# Patient Record
Sex: Female | Born: 1965 | Race: Black or African American | Hispanic: No | Marital: Single | Smoking: Former smoker
Health system: Southern US, Community
[De-identification: ages and names within clinical notes are randomized; demographics above are authoritative.]

## PROBLEM LIST (undated history)

## (undated) DIAGNOSIS — J449 Chronic obstructive pulmonary disease, unspecified: Secondary | ICD-10-CM

## (undated) DIAGNOSIS — J219 Acute bronchiolitis, unspecified: Secondary | ICD-10-CM

## (undated) HISTORY — PX: ABDOMINAL HYSTERECTOMY: SHX81

---

## 2021-09-29 ENCOUNTER — Ambulatory Visit
Admission: EM | Admit: 2021-09-29 | Discharge: 2021-09-29 | Disposition: A | Discharge status: Home / Self Care | Payer: Self-pay | Attending: Family Medicine | Admitting: Family Medicine

## 2021-09-29 ENCOUNTER — Ambulatory Visit (INDEPENDENT_AMBULATORY_CARE_PROVIDER_SITE_OTHER): Payer: Self-pay

## 2021-09-29 ENCOUNTER — Other Ambulatory Visit: Payer: Self-pay

## 2021-09-29 ENCOUNTER — Encounter: Payer: Self-pay | Admitting: Emergency Medicine

## 2021-09-29 DIAGNOSIS — M79642 Pain in left hand: Secondary | ICD-10-CM

## 2021-09-29 DIAGNOSIS — J3089 Other allergic rhinitis: Secondary | ICD-10-CM

## 2021-09-29 MED ORDER — PREDNISONE 20 MG PO TABS: 40.0000 mg | ORAL_TABLET | Freq: Every day | ORAL | 0 refills | Status: DC

## 2021-09-29 MED ORDER — FLUTICASONE PROPIONATE 50 MCG/ACT NA SUSP: 1.0000 | Freq: Every day | NASAL | 0 refills | Status: DC

## 2021-09-29 MED ORDER — DICLOFENAC SODIUM 75 MG PO TBEC: 75.0000 mg | DELAYED_RELEASE_TABLET | Freq: Two times a day (BID) | ORAL | 0 refills | Status: DC

## 2021-09-29 NOTE — ED Triage Notes (Signed)
Pt here for left hand pain after getting caught in machine at work yesterday; pt also sts increased nasal congestion and sneezing x 3 days

## 2021-10-02 NOTE — ED Provider Notes (Signed)
  [facility]   [id number] 09/29/21 Arrival Time: 1106  ASSESSMENT & PLAN:  1. Left hand pain   2. Non-seasonal allergic rhinitis due to other allergic trigger    I have personally viewed the imaging studies ordered this visit. No hand fracture appreciated.  OTC symptom care as needed.  Meds ordered this encounter  Medications   predniSONE (DELTASONE) 20 MG tablet    Sig: Take 2 tablets (40 mg total) by mouth daily.    Dispense:  10 tablet    Refill:  0   diclofenac (VOLTAREN) 75 MG EC tablet    Sig: Take 1 tablet (75 mg total) by mouth 2 (two) times daily.    Dispense:  14 tablet    Refill:  0   fluticasone (FLONASE) 50 MCG/ACT nasal spray    Sig: Place 1 spray into both nostrils daily.    Dispense:  16 g    Refill:  0   May f/u here as needed  Reviewed expectations re: course of current medical issues. Questions answered. Outlined signs and symptoms indicating need for more acute intervention. Understanding verbalized. After Visit Summary given.   SUBJECTIVE: History from: patient. April Ford is a 55 y.o. female who reports nasal cong and wheezing; x 2 days. Denies: fever. Normal PO intake without n/v/d. Also with L dorsal hand pain. Hit on machine at work yesterday; 'sore'; no tx PTA. No extremity sensation changes or weakness.   OBJECTIVE:  Vitals:   09/29/21 1203  BP: 135/69  Pulse: (!) 58  Resp: 18  Temp: 99 F (37.2 C)  TempSrc: Oral  SpO2: 99%    General appearance: alert; no distress Eyes: PERRLA; EOMI; conjunctiva normal HENT: [place]; AT; with nasal congestion Neck: supple  Lungs: speaks full sentences without difficulty; unlabored Extremities: no edema; L hand with vague TTP dorsally; all fingers with FROM Skin: warm and dry Neurologic: normal gait Psychological: alert and cooperative; normal mood and affect  Imaging: DG Hand Complete Left  Result Date: 09/29/2021 CLINICAL DATA:  Dorsal hand pain EXAM: LEFT HAND - COMPLETE 3 VIEW  COMPARISON:  None. FINDINGS: There is no evidence of fracture or dislocation. There is no evidence of arthropathy or other focal bone abnormality. Soft tissues are unremarkable. IMPRESSION: Negative. Electronically Signed   By: Allegra Lai M.D.   On: 09/29/2021 12:57    No Known Allergies  History reviewed. No pertinent past medical history. Social History   Socioeconomic History   Marital status: Single    Spouse name: Not on file   Number of children: Not on file   Years of education: Not on file   Highest education level: Not on file  Occupational History   Not on file  Tobacco Use   Smoking status: Never   Smokeless tobacco: Never  Substance and Sexual Activity   Alcohol use: Not Currently   Drug use: Not Currently   Sexual activity: Not on file  Other Topics Concern   Not on file  Social History Narrative   Not on file   Social Determinants of Health   Financial Resource Strain: Not on file  Food Insecurity: Not on file  Transportation Needs: Not on file  Physical Activity: Not on file  Stress: Not on file  Social Connections: Not on file  Intimate Partner Violence: Not on file   History reviewed. No pertinent family history. History reviewed. No pertinent surgical history.   Mardella Layman, MD 10/02/21 [phone]

## 2021-10-21 ENCOUNTER — Ambulatory Visit
Admission: EM | Admit: 2021-10-21 | Discharge: 2021-10-21 | Disposition: A | Discharge status: Home / Self Care | Payer: PRIVATE HEALTH INSURANCE | Attending: Physician Assistant | Admitting: Physician Assistant

## 2021-10-21 ENCOUNTER — Encounter: Payer: Self-pay | Admitting: Emergency Medicine

## 2021-10-21 ENCOUNTER — Other Ambulatory Visit: Payer: Self-pay

## 2021-10-21 DIAGNOSIS — M79642 Pain in left hand: Secondary | ICD-10-CM

## 2021-10-21 MED ORDER — DICLOFENAC SODIUM 75 MG PO TBEC: 75.0000 mg | DELAYED_RELEASE_TABLET | Freq: Two times a day (BID) | ORAL | 0 refills | Status: DC

## 2021-10-21 NOTE — ED Provider Notes (Signed)
[facility]    CSN: [id number] Arrival date & time: 10/21/21  1111      History   Chief Complaint Chief Complaint  Patient presents with   Hand Pain    HPI April Ford is a 55 y.o. female.   Pt reports she injured her hand at work.  Pt reports pain persist.  Pt reports she has a shooting pain up her arm.  Pt reports she can not grip well,  Pt reports difficulty using hand  The history is provided by the patient.  Hand Pain This is a recurrent problem. Episode onset: 3 weeks. The problem occurs constantly. The problem has been gradually worsening. Nothing aggravates the symptoms. Nothing relieves the symptoms.   History reviewed. No pertinent past medical history.  There are no problems to display for this patient.   History reviewed. No pertinent surgical history.  OB History   No obstetric history on file.      Home Medications    Prior to Admission medications   Medication Sig Start Date End Date Taking? Authorizing Provider  diclofenac (VOLTAREN) 75 MG EC tablet Take 1 tablet (75 mg total) by mouth 2 (two) times daily. 10/21/21   Elson Areas, PA-C  fluticasone (FLONASE) 50 MCG/ACT nasal spray Place 1 spray into both nostrils daily. 09/29/21   Mardella Layman, MD  predniSONE (DELTASONE) 20 MG tablet Take 2 tablets (40 mg total) by mouth daily. 09/29/21   Mardella Layman, MD    Family History History reviewed. No pertinent family history.  Social History Social History   Tobacco Use   Smoking status: Never   Smokeless tobacco: Never  Substance Use Topics   Alcohol use: Not Currently   Drug use: Not Currently     Allergies   Patient has no known allergies.   Review of Systems Review of Systems  All other systems reviewed and are negative.   Physical Exam Triage Vital Signs ED Triage Vitals  Enc Vitals Group     BP 10/21/21 1357 121/74     Pulse Rate 10/21/21 1357 62     Resp 10/21/21 1357 15     Temp 10/21/21 1357 98.4 F (36.9  C)     Temp Source 10/21/21 1357 Oral     SpO2 10/21/21 1357 98 %     Weight --      Height --      Head Circumference --      Peak Flow --      Pain Score 10/21/21 1356 8     Pain Loc --      Pain Edu? --      Excl. in GC? --    No data found.  Updated Vital Signs BP 121/74 (BP Location: Right Arm)   Pulse 62   Temp 98.4 F (36.9 C) (Oral)   Resp 15   SpO2 98%   Visual Acuity Right Eye Distance:   Left Eye Distance:   Bilateral Distance:    Right Eye Near:   Left Eye Near:    Bilateral Near:     Physical Exam Vitals and nursing note reviewed.  Constitutional:      Appearance: She is well-developed.  HENT:     Head: Normocephalic.  Cardiovascular:     Rate and Rhythm: Normal rate.  Pulmonary:     Effort: Pulmonary effort is normal.  Abdominal:     General: There is no distension.  Musculoskeletal:        General:  Normal range of motion.     Cervical back: Normal range of motion.  Skin:    General: Skin is warm.  Neurological:     General: No focal deficit present.     Mental Status: She is alert and oriented to person, place, and time.  Psychiatric:        Mood and Affect: Mood normal.     UC Treatments / Results  Labs (all labs ordered are listed, but only abnormal results are displayed) Labs Reviewed - No data to display  EKG   Radiology No results found.  Procedures Procedures (including critical care time)  Medications Ordered in UC Medications - No data to display  Initial Impression / Assessment and Plan / UC Course  I have reviewed the triage vital signs and the nursing notes.  Pertinent labs & imaging results that were available during my care of the patient were reviewed by me and considered in my medical decision making (see chart for details).     MDM: xray reviewed,  Final Clinical Impressions(s) / UC Diagnoses   Final diagnoses:  Hand pain, left   Discharge Instructions   None    ED Prescriptions     Medication  Sig Dispense Auth. Provider   diclofenac (VOLTAREN) 75 MG EC tablet Take 1 tablet (75 mg total) by mouth 2 (two) times daily. 30 tablet Elson Areas, [place]      PDMP not reviewed this encounter.   Elson Areas, [place] 10/21/21 1709

## 2021-10-21 NOTE — ED Triage Notes (Signed)
Patient c/o LFT hand pain x 3 weeks.   Patient states " I was seen here previously, when my hand got stuck in the converter belt at work".   Patient endorses worsening pain, with shooting "with up hand and up elbow".   Patient endorses difficulty with picking up items.   Prednisone and Voltaren with no relief of symptoms.   Patient has used compression with relief of symptoms.

## 2021-10-22 ENCOUNTER — Ambulatory Visit: Payer: PRIVATE HEALTH INSURANCE | Admitting: Orthopedic Surgery

## 2021-10-25 ENCOUNTER — Encounter: Payer: Self-pay | Admitting: Orthopedic Surgery

## 2021-10-25 ENCOUNTER — Other Ambulatory Visit: Payer: Self-pay

## 2021-10-25 ENCOUNTER — Ambulatory Visit (INDEPENDENT_AMBULATORY_CARE_PROVIDER_SITE_OTHER): Payer: Self-pay | Admitting: Orthopedic Surgery

## 2021-10-25 ENCOUNTER — Ambulatory Visit (INDEPENDENT_AMBULATORY_CARE_PROVIDER_SITE_OTHER): Payer: Self-pay

## 2021-10-25 VITALS — BP 110/63 | HR 57 | Ht 67.0 in | Wt 171.4 lb

## 2021-10-25 DIAGNOSIS — M79645 Pain in left finger(s): Secondary | ICD-10-CM

## 2021-10-25 NOTE — Progress Notes (Signed)
Office Visit Note   Patient: April Ford           Date of Birth: [birth year]           MRN: [id number] Visit Date: 10/25/2021              Requested by: No referring provider defined for this encounter. PCP: Pcp, No   Assessment & Plan: Visit Diagnoses:  1. Pain of left thumb     Plan: Discussed with patient that I don't see any acute injury on x-ray and she has minimal degenerative changes.  She has full ROM of the thumb which makes a tendon injury less likely.  She may have a bony and soft tissue contusion of her thumb  given her TTP along the entire metacarpal.  We will give her a removable thumb spica brace.  I will also write her a prescription for diclofenac to help with inflammation and pain.  I can see her back in a month if she's still having problems.   Follow-Up Instructions: No follow-ups on file.   Orders:  Orders Placed This Encounter  Procedures   XR Finger Thumb Left   No orders of the defined types were placed in this encounter.     Procedures: No procedures performed   Clinical Data: No additional findings.   Subjective: Chief Complaint  Patient presents with   Left Hand - New Patient (Initial Visit)    This is a 55 year old right-hand-dominant female who presents with left thumb pain after a workplace injury on 10 5.  She notes that she got her left hand caught in a machine on this date.  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  She was seen in the ER on the date of injury for thumb pain and nasal congestion.  X-rays taken at that time did not demonstrate any acute bony injury.  She then returned to the ER several weeks later with continued symptoms.  She was given a prescription prescription for prednisone which was not filled secondary to reported insurance issues.  She has been wearing a soft glove which she thinks helps a little bit.  She describes the pain as throbbing and localized to the thumb metacarpal.  Is 9/10 at worst.  She has no pain with  range of motion.   Review of Systems   Objective: Vital Signs: BP 110/63 (BP Location: Right Arm, Patient Position: Sitting, Cuff Size: Normal)   Pulse (!) 57   Ht 5\' 7"  (1.702 m)   Wt 171 lb 6.4 oz (77.7 kg)   SpO2 98%   BMI 26.85 kg/m   Physical Exam Constitutional:      Appearance: Normal appearance.  Cardiovascular:     Rate and Rhythm: Normal rate.     Pulses: Normal pulses.  Pulmonary:     Effort: Pulmonary effort is normal.  Skin:    General: Skin is warm and dry.     Capillary Refill: Capillary refill takes less than 2 seconds.  Neurological:     Mental Status: She is alert.    Left Hand Exam   Tenderness  Left hand tenderness location: TTP along thumb MC including CMC joint.  No pain at MP or IP joint of thumb.  No radial wrist pain proximal to the [facility] joint.   Other  Erythema: absent Sensation: none Pulse: present  Comments:  Full thumb IP, MP, and CMC ROM with minimal pain.   Full thumb opposition.  No pain w/ CMC grind test.  No TTP at radial styloid.  TTP along entire thumb MC and 1st web space.      Specialty Comments:  No specialty comments available.  Imaging: 3 views of the left thumb taken today are reviewed interpreted by me.  They demonstrate no acute evidence of bony fracture.  There is no evidence of thumb CMC instability.   PMFS History: There are no problems to display for this patient.  History reviewed. No pertinent past medical history.  History reviewed. No pertinent family history.  History reviewed. No pertinent surgical history. Social History   Occupational History   Not on file  Tobacco Use   Smoking status: Never   Smokeless tobacco: Never  Substance and Sexual Activity   Alcohol use: Not Currently   Drug use: Not Currently   Sexual activity: Not on file

## 2021-10-26 ENCOUNTER — Telehealth: Payer: Self-pay

## 2021-10-26 MED ORDER — FLUTICASONE PROPIONATE 50 MCG/ACT NA SUSP
1.0000 | Freq: Every day | NASAL | 0 refills | Status: DC
Start: 2021-10-26 — End: 2022-09-05

## 2021-10-26 MED ORDER — DICLOFENAC SODIUM 75 MG PO TBEC: 75.0000 mg | DELAYED_RELEASE_TABLET | Freq: Two times a day (BID) | ORAL | 0 refills | Status: DC

## 2021-10-26 MED ORDER — PREDNISONE 20 MG PO TABS: 40.0000 mg | ORAL_TABLET | Freq: Every day | ORAL | 0 refills | Status: DC

## 2021-10-27 ENCOUNTER — Telehealth: Payer: Self-pay | Admitting: Orthopedic Surgery

## 2021-10-27 ENCOUNTER — Encounter: Payer: Self-pay | Admitting: Radiology

## 2021-10-27 NOTE — Telephone Encounter (Signed)
Pt called wondering if she is suppose to be out of work? If so she needs a work note, nad if not she needs to know what her work restrictions are.   CB [phone]

## 2021-10-27 NOTE — Telephone Encounter (Signed)
I called and advised pt, and note was written and per pts request it has been mailed to her.

## 2021-11-23 ENCOUNTER — Ambulatory Visit: Payer: Self-pay | Admitting: Orthopedic Surgery

## 2021-11-23 ENCOUNTER — Other Ambulatory Visit: Payer: Self-pay

## 2021-11-23 ENCOUNTER — Ambulatory Visit (INDEPENDENT_AMBULATORY_CARE_PROVIDER_SITE_OTHER): Payer: PRIVATE HEALTH INSURANCE | Admitting: Orthopedic Surgery

## 2021-11-23 DIAGNOSIS — M79645 Pain in left finger(s): Secondary | ICD-10-CM | POA: Insufficient documentation

## 2021-11-23 NOTE — Progress Notes (Signed)
Office Visit Note   Patient: April Ford           Date of Birth: 11-07-1966           MRN: [id number] Visit Date: 11/23/2021              Requested by: No referring provider defined for this encounter. PCP: Pcp, No   Assessment & Plan: Visit Diagnoses:  1. Pain of left thumb     Plan: Discussed with patient that her current pain seems localized to her [facility] joint.  Her previous pain was more diffuse and along the entire metacarpal which is improved.  She has been wearing a thumb spica brace.  She has not been taking any medications.  We discussed treatment options for early degenerative changes at the [facility] joint.  At this point, she wants to start with PO anti-inflammatories or diclofenac gel.  She will return to the office in 2 months or so if she's still having problems.   Follow-Up Instructions: No follow-ups on file.   Orders:  No orders of the defined types were placed in this encounter.  No orders of the defined types were placed in this encounter.     Procedures: No procedures performed   Clinical Data: No additional findings.   Subjective: Chief Complaint  Patient presents with   Left Thumb - Follow-up    Is a 55 year old right-hand-dominant female who presents for follow-up of left thumb pain.  She was last seen on 10/31 at which time she had diffuse pain along the left thumb metacarpal after a workplace injury on 10 5.  She is been in a thumb spica brace since that time.  She says that her pain has improved since then.  Her pain is localized to the [facility] joint today.  She has not taken any medication for this.  She has been able to use her hand with some difficulty with prolonged activity such as cooking over Thanksgiving.  Her pain was previously 9/10 at worst and is much better not today.  She has no pain with range of motion of this thumb.   Review of Systems   Objective: Vital Signs: There were no vitals taken for this visit.  Physical  Exam Cardiovascular:     Rate and Rhythm: Normal rate.     Pulses: Normal pulses.  Pulmonary:     Effort: Pulmonary effort is normal.  Skin:    General: Skin is warm and dry.     Capillary Refill: Capillary refill takes less than 2 seconds.  Neurological:     Mental Status: She is alert.    Left Hand Exam   Tenderness  Left hand tenderness location: TTP at thumb CMC joint.   Other  Erythema: absent Sensation: normal Pulse: present  Comments:  Pain w/ CMC grind test but without crepitation.  No TTP along thumb metacarpal.  Full and painless ROM of thumb.      Specialty Comments:  No specialty comments available.  Imaging: No results found.   PMFS History: Patient Active Problem List   Diagnosis Date Noted   Pain of left thumb 11/23/2021   No past medical history on file.  No family history on file.  No past surgical history on file. Social History   Occupational History   Not on file  Tobacco Use   Smoking status: Never   Smokeless tobacco: Never  Substance and Sexual Activity   Alcohol use: Not Currently   Drug use: Not  Currently   Sexual activity: Not on file

## 2021-11-26 ENCOUNTER — Ambulatory Visit: Payer: PRIVATE HEALTH INSURANCE | Admitting: Orthopedic Surgery

## 2021-12-28 ENCOUNTER — Ambulatory Visit: Payer: PRIVATE HEALTH INSURANCE | Admitting: Orthopedic Surgery

## 2022-01-10 ENCOUNTER — Ambulatory Visit (INDEPENDENT_AMBULATORY_CARE_PROVIDER_SITE_OTHER): Payer: Self-pay | Admitting: Orthopedic Surgery

## 2022-01-10 ENCOUNTER — Encounter: Payer: Self-pay | Admitting: Orthopedic Surgery

## 2022-01-10 ENCOUNTER — Other Ambulatory Visit: Payer: Self-pay

## 2022-01-10 VITALS — BP 122/62 | HR 73

## 2022-01-10 DIAGNOSIS — M79645 Pain in left finger(s): Secondary | ICD-10-CM

## 2022-01-10 MED ORDER — MELOXICAM 7.5 MG PO TABS: 7.5000 mg | ORAL_TABLET | Freq: Every day | ORAL | 0 refills | Status: AC

## 2022-01-10 NOTE — Progress Notes (Signed)
° °  Office Visit Note   Patient: April Ford           Date of Birth: 06-20-1966           MRN: [id number] Visit Date: 01/10/2022              Requested by: No referring provider defined for this encounter. PCP: Pcp, No   Assessment & Plan: Visit Diagnoses:  1. Pain of left thumb     Plan: Patient's pain has continued to improve since she was first seen.  Her pain is localized to the first web space.  No appreciable symptoms at the [facility] joint.  She would like to try a different anti-inflammatory.  I will send a prescription for meloxicam and see her back as needed.   Follow-Up Instructions: No follow-ups on file.   Orders:  No orders of the defined types were placed in this encounter.  Meds ordered this encounter  Medications   meloxicam (MOBIC) 7.5 MG tablet    Sig: Take 1 tablet (7.5 mg total) by mouth daily.    Dispense:  30 tablet    Refill:  0      Procedures: No procedures performed   Clinical Data: No additional findings.   Subjective: Chief Complaint  Patient presents with   Left Thumb - Follow-up    This is a 56 yo RHD F who presents for follow up of left thumb pain.  She injured her thumb at work in early October.  Initially, her pain involved her entire thumb.  Radiographs have been negative for injury.  Her pain has continually improved ad now only involves the distal aspect of the web space.  She is pain-free most days but has some days where I can be quite bothersome.  She has no pain today.    Review of Systems   Objective: Vital Signs: BP 122/62 (BP Location: Left Arm, Patient Position: Sitting, Cuff Size: Large)    Pulse 73    SpO2 97%   Physical Exam  Left Hand Exam   Tenderness  Left hand tenderness location: Mild tenderness to palpation at first webspace with no obvious swelling or masses.   Other  Erythema: absent Sensation: normal Pulse: present  Comments:  Negative CMC grind test.  No MP joint instability.  Full thumb ROM.       Specialty Comments:  No specialty comments available.  Imaging: No results found.   PMFS History: Patient Active Problem List   Diagnosis Date Noted   Pain of left thumb 11/23/2021   History reviewed. No pertinent past medical history.  History reviewed. No pertinent family history.  History reviewed. No pertinent surgical history. Social History   Occupational History   Not on file  Tobacco Use   Smoking status: Never   Smokeless tobacco: Never  Substance and Sexual Activity   Alcohol use: Not Currently   Drug use: Not Currently   Sexual activity: Not on file

## 2022-02-09 ENCOUNTER — Other Ambulatory Visit: Payer: Self-pay

## 2022-02-09 ENCOUNTER — Ambulatory Visit
Admission: EM | Admit: 2022-02-09 | Discharge: 2022-02-09 | Disposition: A | Discharge status: Home / Self Care | Payer: Self-pay | Attending: Family Medicine | Admitting: Family Medicine

## 2022-02-09 DIAGNOSIS — J069 Acute upper respiratory infection, unspecified: Secondary | ICD-10-CM

## 2022-02-09 DIAGNOSIS — Z20822 Contact with and (suspected) exposure to covid-19: Secondary | ICD-10-CM

## 2022-02-09 DIAGNOSIS — J441 Chronic obstructive pulmonary disease with (acute) exacerbation: Secondary | ICD-10-CM

## 2022-02-09 DIAGNOSIS — Z20828 Contact with and (suspected) exposure to other viral communicable diseases: Secondary | ICD-10-CM

## 2022-02-09 MED ORDER — PROMETHAZINE-DM 6.25-15 MG/5ML PO SYRP: 5.0000 mL | ORAL_SOLUTION | Freq: Four times a day (QID) | ORAL | 0 refills | Status: DC | PRN

## 2022-02-09 MED ORDER — PREDNISONE 20 MG PO TABS: 40.0000 mg | ORAL_TABLET | Freq: Every day | ORAL | 0 refills | Status: DC

## 2022-02-09 MED ORDER — MOLNUPIRAVIR EUA 200MG CAPSULE: 4.0000 | ORAL_CAPSULE | Freq: Two times a day (BID) | ORAL | 0 refills | Status: AC

## 2022-02-09 NOTE — ED Triage Notes (Signed)
Patient states she was at work today and started feeling bad  Patient states she is experiencing chills, tight chest with a light cough  Patient states that she feels like something heavy is pushing on her chest  Denies Fever

## 2022-02-09 NOTE — ED Provider Notes (Signed)
[facility]    CSN: [id number] Arrival date & time: 02/09/22  1452      History   Chief Complaint Chief Complaint  Patient presents with   Chest Pain    Chills, runny nose and chest tightness    HPI April Ford is a 56 y.o. female.   Presenting today with sudden onset chills, body aches, chest tightness, cough, congestion, headache that started this morning while she was at work.  She denies chest pain, abdominal pain, nausea vomiting or diarrhea.  Has not been taking anything for symptoms thus far.  Has a history of COPD on albuterol as needed but has not taken any of her inhaler today.  States her granddaughter is positive for COVID.   History reviewed. No pertinent past medical history.  Patient Active Problem List   Diagnosis Date Noted   Pain of left thumb 11/23/2021    History reviewed. No pertinent surgical history.  OB History   No obstetric history on file.      Home Medications    Prior to Admission medications   Medication Sig Start Date End Date Taking? Authorizing Provider  molnupiravir EUA (LAGEVRIO) 200 mg CAPS capsule Take 4 capsules (800 mg total) by mouth 2 (two) times daily for 5 days. 02/09/22 02/14/22 Yes Particia Nearing, PA-C  promethazine-dextromethorphan (PROMETHAZINE-DM) 6.25-15 MG/5ML syrup Take 5 mLs by mouth 4 (four) times daily as needed. 02/09/22  Yes Particia Nearing, PA-C  fluticasone [facility]) 50 MCG/ACT nasal spray Place 1 spray into both nostrils daily. 10/26/21   Mardella Layman, MD  meloxicam (MOBIC) 7.5 MG tablet Take 1 tablet (7.5 mg total) by mouth daily. 01/10/22 02/09/22  Marlyne Beards, MD  predniSONE (DELTASONE) 20 MG tablet Take 2 tablets (40 mg total) by mouth daily. 02/09/22   Particia Nearing, PA-C    Family History No family history on file.  Social History Social History   Tobacco Use   Smoking status: Never   Smokeless tobacco: Never  Vaping Use   Vaping Use: Every day    Substances: Flavoring  Substance Use Topics   Alcohol use: Not Currently   Drug use: Not Currently     Allergies   Patient has no known allergies.   Review of Systems Review of Systems Per HPI  Physical Exam Triage Vital Signs ED Triage Vitals  Enc Vitals Group     BP 02/09/22 1617 138/83     Pulse Rate 02/09/22 1617 68     Resp 02/09/22 1617 16     Temp 02/09/22 1617 99.2 F (37.3 C)     Temp Source 02/09/22 1617 Oral     SpO2 02/09/22 1617 97 %     Weight --      Height --      Head Circumference --      Peak Flow --      Pain Score 02/09/22 1619 7     Pain Loc --      Pain Edu? --      Excl. in GC? --    No data found.  Updated Vital Signs BP 138/83 (BP Location: Right Arm)    Pulse 68    Temp 99.2 F (37.3 C) (Oral)    Resp 16    SpO2 97%   Visual Acuity Right Eye Distance:   Left Eye Distance:   Bilateral Distance:    Right Eye Near:   Left Eye Near:    Bilateral Near:  Physical Exam Vitals and nursing note reviewed.  Constitutional:      Appearance: Normal appearance. She is not ill-appearing.  HENT:     Head: Atraumatic.     Right Ear: Tympanic membrane normal.     Left Ear: Tympanic membrane normal.     Nose: Rhinorrhea present.     Mouth/Throat:     Mouth: Mucous membranes are moist.     Pharynx: Posterior oropharyngeal erythema present.  Eyes:     Extraocular Movements: Extraocular movements intact.     Conjunctiva/sclera: Conjunctivae normal.  Cardiovascular:     Rate and Rhythm: Normal rate and regular rhythm.     Heart sounds: Normal heart sounds.  Pulmonary:     Effort: Pulmonary effort is normal.     Breath sounds: Normal breath sounds. No wheezing or rales.  Musculoskeletal:        General: Normal range of motion.     Cervical back: Normal range of motion and neck supple.  Skin:    General: Skin is warm and dry.  Neurological:     Mental Status: She is alert and oriented to person, place, and time.  Psychiatric:         Mood and Affect: Mood normal.        Thought Content: Thought content normal.        Judgment: Judgment normal.   UC Treatments / Results  Labs (all labs ordered are listed, but only abnormal results are displayed) Labs Reviewed  COVID-19, FLU A+B NAA    EKG   Radiology No results found.  Procedures Procedures (including critical care time)  Medications Ordered in UC Medications - No data to display  Initial Impression / Assessment and Plan / UC Course  I have reviewed the triage vital signs and the nursing notes.  Pertinent labs & imaging results that were available during my care of the patient were reviewed by me and considered in my medical decision making (see chart for details).     Vital signs reassuring, suspicious for COVID-19.  We will start antiviral therapy while awaiting COVID and flu results as well as treating COPD exacerbation with prednisone, Phenergan DM and her albuterol as needed.  Discussed supportive over-the-counter medications and home care.  Work note given.  Return for acutely worsening symptoms  Final Clinical Impressions(s) / UC Diagnoses   Final diagnoses:  Exposure to the flu  Viral URI with cough  Exposure to COVID-19 virus  COPD exacerbation [facility])   Discharge Instructions   None    ED Prescriptions     Medication Sig Dispense Auth. Provider   predniSONE (DELTASONE) 20 MG tablet Take 2 tablets (40 mg total) by mouth daily. 10 tablet Particia Nearing, PA-C   molnupiravir EUA (LAGEVRIO) 200 mg CAPS capsule Take 4 capsules (800 mg total) by mouth 2 (two) times daily for 5 days. 40 capsule Particia Nearing, [place]   promethazine-dextromethorphan (PROMETHAZINE-DM) 6.25-15 MG/5ML syrup Take 5 mLs by mouth 4 (four) times daily as needed. 100 mL Particia Nearing, [place]      PDMP not reviewed this encounter.   Particia Nearing, [place] 02/09/22 1728

## 2022-02-10 LAB — COVID-19, FLU A+B NAA
Influenza A, NAA: NOT DETECTED
Influenza B, NAA: NOT DETECTED
SARS-CoV-2, NAA: DETECTED — AB

## 2022-08-01 ENCOUNTER — Ambulatory Visit: Payer: Self-pay | Admitting: Family Medicine

## 2022-08-08 ENCOUNTER — Ambulatory Visit: Payer: Self-pay | Admitting: Family Medicine

## 2022-09-05 ENCOUNTER — Ambulatory Visit: Payer: BC Managed Care – PPO | Admitting: Family Medicine

## 2022-09-05 ENCOUNTER — Encounter: Payer: Self-pay | Admitting: *Deleted

## 2022-09-05 VITALS — BP 108/64 | HR 64 | Temp 97.5°F | Ht 67.0 in | Wt 192.0 lb

## 2022-09-05 DIAGNOSIS — J449 Chronic obstructive pulmonary disease, unspecified: Secondary | ICD-10-CM | POA: Diagnosis not present

## 2022-09-05 DIAGNOSIS — Z130 Encounter for screening for diseases of the blood and blood-forming organs and certain disorders involving the immune mechanism: Secondary | ICD-10-CM

## 2022-09-05 DIAGNOSIS — Z13220 Encounter for screening for lipoid disorders: Secondary | ICD-10-CM | POA: Diagnosis not present

## 2022-09-05 DIAGNOSIS — E782 Mixed hyperlipidemia: Secondary | ICD-10-CM

## 2022-09-05 DIAGNOSIS — Z0000 Encounter for general adult medical examination without abnormal findings: Secondary | ICD-10-CM

## 2022-09-05 DIAGNOSIS — Z1211 Encounter for screening for malignant neoplasm of colon: Secondary | ICD-10-CM | POA: Diagnosis not present

## 2022-09-05 NOTE — Patient Instructions (Signed)
Labs today.  Call [phone] to schedule mammogram.  I have place the referral for colonoscopy.  Schedule Pap smear when you like.  Take care  Dr. Adriana Simas

## 2022-09-06 DIAGNOSIS — E782 Mixed hyperlipidemia: Secondary | ICD-10-CM | POA: Insufficient documentation

## 2022-09-06 DIAGNOSIS — J449 Chronic obstructive pulmonary disease, unspecified: Secondary | ICD-10-CM | POA: Insufficient documentation

## 2022-09-06 DIAGNOSIS — Z0000 Encounter for general adult medical examination without abnormal findings: Secondary | ICD-10-CM | POA: Insufficient documentation

## 2022-09-06 LAB — CMP14+EGFR
ALT: 8 IU/L (ref 0–32)
AST: 16 IU/L (ref 0–40)
Albumin/Globulin Ratio: 1.8 (ref 1.2–2.2)
Albumin: 4.5 g/dL (ref 3.8–4.9)
Alkaline Phosphatase: 87 IU/L (ref 44–121)
BUN/Creatinine Ratio: 10 (ref 9–23)
BUN: 10 mg/dL (ref 6–24)
Bilirubin Total: 0.5 mg/dL (ref 0.0–1.2)
CO2: 23 mmol/L (ref 20–29)
Calcium: 9.6 mg/dL (ref 8.7–10.2)
Chloride: 106 mmol/L (ref 96–106)
Creatinine, Ser: 0.99 mg/dL (ref 0.57–1.00)
Globulin, Total: 2.5 g/dL (ref 1.5–4.5)
Glucose: 75 mg/dL (ref 70–99)
Potassium: 3.7 mmol/L (ref 3.5–5.2)
Sodium: 146 mmol/L — ABNORMAL HIGH (ref 134–144)
Total Protein: 7 g/dL (ref 6.0–8.5)
eGFR: 67 mL/min/{1.73_m2} (ref >59)

## 2022-09-06 LAB — CBC
Hematocrit: 38.5 % (ref 34.0–46.6)
Hemoglobin: 12.6 g/dL (ref 11.1–15.9)
MCH: 29.9 pg (ref 26.6–33.0)
MCHC: 32.7 g/dL (ref 31.5–35.7)
MCV: 91 fL (ref 79–97)
Platelets: 225 10*3/uL (ref 150–450)
RBC: 4.22 x10E6/uL (ref 3.77–5.28)
RDW: 12.4 % (ref 11.7–15.4)
WBC: 8.3 10*3/uL (ref 3.4–10.8)

## 2022-09-06 LAB — LIPID PANEL
Chol/HDL Ratio: 3.8 ratio (ref 0.0–4.4)
Cholesterol, Total: 184 mg/dL (ref 100–199)
HDL: 48 mg/dL (ref >39)
LDL Chol Calc (NIH): 103 mg/dL — ABNORMAL HIGH (ref 0–99)
Triglycerides: 192 mg/dL — ABNORMAL HIGH (ref 0–149)
VLDL Cholesterol Cal: 33 mg/dL (ref 5–40)

## 2022-09-06 NOTE — Assessment & Plan Note (Signed)
Lipid panel obtained and revealed mixed hyperlipidemia.  Patient is not at high risk at this time.  Recommend dietary changes.

## 2022-09-06 NOTE — Progress Notes (Signed)
Subjective:  Patient ID: April Ford, female    DOB: August 08, 1966  Age: 56 y.o. MRN: [id number]  CC: Chief Complaint  Patient presents with   Establish Care    Seasonal allergies, ear itching, sniffles, throat clearing    HPI:  56 year old female with a history of allergies as well as COPD presents to establish care.  Patient has not seen a doctor in quite a few years.  She recently moved from [place].  She has never had a colonoscopy.  She is overdue for mammogram and Pap smear as well.  Currently she reports that she is feeling well.  No chest pain.  She does state that she has some baseline shortness of breath due to underlying COPD.  She does not use any controller inhalers.  She is a former smoker.  She currently vapes.  Patient Active Problem List   Diagnosis Date Noted   COPD (chronic obstructive pulmonary disease) ([place]) 09/06/2022   Mixed hyperlipidemia 09/06/2022   Preventative health care 09/06/2022    Social Hx   Social History   Socioeconomic History   Marital status: Single    Spouse name: Not on file   Number of children: Not on file   Years of education: Not on file   Highest education level: Not on file  Occupational History   Not on file  Tobacco Use   Smoking status: Never   Smokeless tobacco: Never  Vaping Use   Vaping Use: Every day   Substances: Flavoring  Substance and Sexual Activity   Alcohol use: Not Currently   Drug use: Not Currently   Sexual activity: Not Currently    Birth control/protection: None  Other Topics Concern   Not on file  Social History Narrative   Not on file   Social Determinants of Health   Financial Resource Strain: Not on file  Food Insecurity: Not on file  Transportation Needs: Not on file  Physical Activity: Not on file  Stress: Not on file  Social Connections: Not on file    Review of Systems Per HPI  Objective:  BP 108/64   Pulse 64   Temp (!) 97.5 F (36.4 C)   Ht '5\' 7"'  (1.702 m)   Wt 192 lb  (87.1 kg)   SpO2 98%   BMI 30.07 kg/m      09/05/2022    1:14 PM 02/09/2022    4:17 PM 01/10/2022   11:00 AM  BP/Weight  Systolic BP 973 532 992  Diastolic BP 64 83 62  Wt. (Lbs) 192    BMI 30.07 kg/m2      Physical Exam Vitals and nursing note reviewed.  Constitutional:      General: She is not in acute distress.    Appearance: Normal appearance. She is not ill-appearing.  HENT:     Head: Normocephalic and atraumatic.  Eyes:     General:        Right eye: No discharge.        Left eye: No discharge.     Conjunctiva/sclera: Conjunctivae normal.  Cardiovascular:     Rate and Rhythm: Normal rate and regular rhythm.  Pulmonary:     Effort: Pulmonary effort is normal.     Breath sounds: Normal breath sounds. No wheezing, rhonchi or rales.  Neurological:     Mental Status: She is alert.  Psychiatric:        Mood and Affect: Mood normal.        Behavior: Behavior normal.  Lab Results  Component Value Date   WBC 8.3 09/05/2022   HGB 12.6 09/05/2022   HCT 38.5 09/05/2022   PLT 225 09/05/2022   GLUCOSE 75 09/05/2022   CHOL 184 09/05/2022   TRIG 192 (H) 09/05/2022   HDL 48 09/05/2022   LDLCALC 103 (H) 09/05/2022   ALT 8 09/05/2022   AST 16 09/05/2022   NA 146 (H) 09/05/2022   K 3.7 09/05/2022   CL 106 09/05/2022   CREATININE 0.99 09/05/2022   BUN 10 09/05/2022   CO2 23 09/05/2022     Assessment & Plan:   Problem List Items Addressed This Visit       Respiratory   COPD (chronic obstructive pulmonary disease) ([place]) - Primary    Stable clinically.  We will continue to monitor.      Relevant Medications   cetirizine (ZYRTEC) 10 MG tablet   Other Relevant Orders   CMP14+EGFR (Completed)     Other   Mixed hyperlipidemia    Lipid panel obtained and revealed mixed hyperlipidemia.  Patient is not at high risk at this time.  Recommend dietary changes.      Preventative health care    Number given to the hospital for patient to schedule  mammogram. Patient will return at a later date to get Pap smear. Patient is amenable to colonoscopy.  Referral placed.      Other Visit Diagnoses     Screening for deficiency anemia       Relevant Orders   CBC (Completed)   Screening, lipid       Relevant Orders   Lipid panel (Completed)   Encounter for screening colonoscopy       Relevant Orders   Ambulatory referral to Gastroenterology       Follow-up:  For pap smear   [place]

## 2022-09-06 NOTE — Assessment & Plan Note (Signed)
Stable clinically.  We will continue to monitor.

## 2022-09-06 NOTE — Assessment & Plan Note (Signed)
Number given to the hospital for patient to schedule mammogram. Patient will return at a later date to get Pap smear. Patient is amenable to colonoscopy.  Referral placed.

## 2022-09-12 ENCOUNTER — Ambulatory Visit (INDEPENDENT_AMBULATORY_CARE_PROVIDER_SITE_OTHER): Payer: BC Managed Care – PPO | Admitting: Nurse Practitioner

## 2022-09-12 ENCOUNTER — Encounter: Payer: Self-pay | Admitting: Nurse Practitioner

## 2022-09-12 VITALS — BP 112/72 | Ht 67.0 in | Wt 192.4 lb

## 2022-09-12 DIAGNOSIS — Z01419 Encounter for gynecological examination (general) (routine) without abnormal findings: Secondary | ICD-10-CM | POA: Diagnosis not present

## 2022-09-12 DIAGNOSIS — Z124 Encounter for screening for malignant neoplasm of cervix: Secondary | ICD-10-CM

## 2022-09-12 NOTE — Progress Notes (Signed)
   Subjective:    Patient ID: April Ford, female    DOB: 1966/09/06, 56 y.o.   MRN: [id number]  HPI Patient arrives for a Pap Smear.  Patient reports no problems or concerns.  Patient had recent visit with primary care to establish care and for health maintenance.  Patient recently moved here from [place].  Patient states that she does not recall the last time she had a Pap smear.  Patient also states that she has had a hysterectomy but unsure if her cervix is still in place.  Patient denies ever having any abnormal Pap smears.  Patient denies any vaginal discharge, vaginal smells, vaginal itching, or other concerns.   Review of Systems  All other systems reviewed and are negative.      Objective:   Physical Exam Exam conducted with a chaperone present.  Chest:     Chest wall: No mass, lacerations, deformity, swelling, tenderness, crepitus or edema. There is no dullness to percussion.  Breasts:    Breasts are symmetrical.     Right: Normal. No swelling, bleeding, inverted nipple, mass, nipple discharge, skin change or tenderness.     Left: Normal. No swelling, bleeding, inverted nipple, mass, nipple discharge, skin change or tenderness.  Abdominal:     Hernia: There is no hernia in the left inguinal area or right inguinal area.  Genitourinary:    General: Normal vulva.     Exam position: Lithotomy position.     Labia:        Right: No rash, tenderness, lesion or injury.        Left: No rash, tenderness, lesion or injury.      Urethra: No prolapse, urethral pain, urethral swelling or urethral lesion.     Vagina: No signs of injury and foreign body. No vaginal discharge, erythema, tenderness, bleeding, lesions or prolapsed vaginal walls.     Uterus: Absent.      Rectum: Normal.     Comments: Cervix vs vaginal cuff noted on exam. Cervical os not well visualized on exam.  No uterus palpated on exam.  No adnexa noted on exam. Lymphadenopathy:     Upper Body:     Right upper  body: No supraclavicular, axillary or pectoral adenopathy.     Left upper body: No supraclavicular, axillary or pectoral adenopathy.     Lower Body: No right inguinal adenopathy. No left inguinal adenopathy.        Assessment & Plan:   1. Encounter for annual routine gynecological examination -Pap completed today -STD testing declined today  2. Screening for cervical cancer - Due to history of hysterectomy, recommend either records from previous provider be sent to clinic or have vaginal ultrasound to confirm presence of cervix.   -Cervix versus vaginal cuff noted on exam.  No cervical os noted on exam. -If patient does not have a cervix there is no need for continued Pap smears. - IGP, rfx Aptima HPV ASCU  -Follow-up with PCP as scheduled.    Note:  This document was prepared using Dragon voice recognition software and may include unintentional dictation errors. Note - This record has been created using [employer].  Chart creation errors have been sought, but may not always  have been located. Such creation errors do not reflect on  the standard of medical care.

## 2022-09-13 ENCOUNTER — Other Ambulatory Visit: Payer: Self-pay | Admitting: Nurse Practitioner

## 2022-09-13 DIAGNOSIS — A599 Trichomoniasis, unspecified: Secondary | ICD-10-CM | POA: Insufficient documentation

## 2022-09-13 LAB — IGP, RFX APTIMA HPV ASCU

## 2022-09-13 LAB — SPECIMEN STATUS REPORT

## 2022-09-13 MED ORDER — METRONIDAZOLE 500 MG PO TABS: 500.0000 mg | ORAL_TABLET | Freq: Two times a day (BID) | ORAL | 0 refills | Status: AC

## 2022-10-03 ENCOUNTER — Other Ambulatory Visit: Payer: Self-pay | Admitting: *Deleted

## 2022-10-03 DIAGNOSIS — Z1231 Encounter for screening mammogram for malignant neoplasm of breast: Secondary | ICD-10-CM

## 2022-10-06 ENCOUNTER — Emergency Department (HOSPITAL_COMMUNITY)
Admission: EM | Admit: 2022-10-06 | Discharge: 2022-10-06 | Disposition: A | Discharge status: Home / Self Care | Payer: BC Managed Care – PPO | Attending: Emergency Medicine | Admitting: Emergency Medicine

## 2022-10-06 ENCOUNTER — Emergency Department (HOSPITAL_COMMUNITY): Payer: BC Managed Care – PPO

## 2022-10-06 ENCOUNTER — Other Ambulatory Visit: Payer: Self-pay

## 2022-10-06 ENCOUNTER — Encounter (HOSPITAL_COMMUNITY): Payer: Self-pay

## 2022-10-06 DIAGNOSIS — R0789 Other chest pain: Secondary | ICD-10-CM | POA: Insufficient documentation

## 2022-10-06 DIAGNOSIS — R079 Chest pain, unspecified: Secondary | ICD-10-CM | POA: Diagnosis present

## 2022-10-06 DIAGNOSIS — J449 Chronic obstructive pulmonary disease, unspecified: Secondary | ICD-10-CM | POA: Insufficient documentation

## 2022-10-06 HISTORY — DX: Acute bronchiolitis, unspecified: J21.9

## 2022-10-06 HISTORY — DX: Chronic obstructive pulmonary disease, unspecified: J44.9

## 2022-10-06 LAB — CBC
HCT: 35.3 % — ABNORMAL LOW (ref 36.0–46.0)
Hemoglobin: 11.9 g/dL — ABNORMAL LOW (ref 12.0–15.0)
MCH: 30.4 pg (ref 26.0–34.0)
MCHC: 33.7 g/dL (ref 30.0–36.0)
MCV: 90.3 fL (ref 80.0–100.0)
Platelets: 213 10*3/uL (ref 150–400)
RBC: 3.91 MIL/uL (ref 3.87–5.11)
RDW: 12.9 % (ref 11.5–15.5)
WBC: 9.2 10*3/uL (ref 4.0–10.5)
nRBC: 0 % (ref 0.0–0.2)

## 2022-10-06 LAB — BASIC METABOLIC PANEL
Anion gap: 9 (ref 5–15)
BUN: 17 mg/dL (ref 6–20)
CO2: 24 mmol/L (ref 22–32)
Calcium: 9.2 mg/dL (ref 8.9–10.3)
Chloride: 108 mmol/L (ref 98–111)
Creatinine, Ser: 0.98 mg/dL (ref 0.44–1.00)
GFR, Estimated: >60 mL/min (ref >60)
Glucose, Bld: 91 mg/dL (ref 70–99)
Potassium: 3.7 mmol/L (ref 3.5–5.1)
Sodium: 141 mmol/L (ref 135–145)

## 2022-10-06 LAB — TROPONIN I (HIGH SENSITIVITY): Troponin I (High Sensitivity): 3 ng/L (ref <18)

## 2022-10-06 MED ORDER — KETOROLAC TROMETHAMINE 15 MG/ML IJ SOLN
15.0000 mg | Freq: Once | INTRAMUSCULAR | Status: DC
  Filled 2022-10-06: qty 1

## 2022-10-06 MED ORDER — METHOCARBAMOL 500 MG PO TABS: 500.0000 mg | ORAL_TABLET | Freq: Three times a day (TID) | ORAL | 0 refills | Status: DC | PRN

## 2022-10-06 MED ORDER — OXYCODONE-ACETAMINOPHEN 5-325 MG PO TABS: 1.0000 | ORAL_TABLET | Freq: Three times a day (TID) | ORAL | 0 refills | Status: DC | PRN

## 2022-10-06 NOTE — ED Provider Notes (Signed)
[facility] EMERGENCY DEPARTMENT Provider Note   CSN: [id number] Arrival date & time: 10/06/22  1445     History  Chief Complaint  Patient presents with   Chest Pain    April Ford is a 56 y.o. female.   Chest Pain   Patient presents with chest pain.  Constant for the last 2 days.  Anterior mid chest.  Worse with bending over.  Has eaten less just because she felt bad with the pain.  No nausea or vomiting.  Not exertional.  States it started at work but was not an injury.  Has not had pain with exertion but pain has been constant.  No fevers or chills.  No coughing.  No cardiac history of early cardiac disease.  Does not smoke.  Denies drug use.    Past Medical History:  Diagnosis Date   Bronchiolitis    COPD (chronic obstructive pulmonary disease) ([facility])     Home Medications Prior to Admission medications   Medication Sig Start Date End Date Taking? Authorizing Provider  methocarbamol (ROBAXIN) 500 MG tablet Take 1 tablet (500 mg total) by mouth every 8 (eight) hours as needed for muscle spasms. 10/06/22  Yes Davonna Belling, MD  oxyCODONE-acetaminophen (PERCOCET/ROXICET) 5-325 MG tablet Take 1-2 tablets by mouth every 8 (eight) hours as needed for severe pain. 10/06/22  Yes Davonna Belling, MD  cetirizine (ZYRTEC) 10 MG tablet Take 10 mg by mouth daily.    [provider]      Allergies    Patient has no known allergies.    Review of Systems   Review of Systems  Cardiovascular:  Positive for chest pain.    Physical Exam Updated Vital Signs BP 133/73 (BP Location: Right Arm)   Pulse 67   Temp 98.3 F (36.8 C) (Oral)   Resp 18   Ht 5\' 7"  (1.702 m)   Wt 88 kg   SpO2 98%   BMI 30.38 kg/m  Physical Exam Constitutional:      Appearance: She is well-developed.  Cardiovascular:     Rate and Rhythm: Normal rate and regular rhythm.  Pulmonary:     Effort: Pulmonary effort is normal.  Chest:     Chest wall: Tenderness present.     Comments:  Moderate tenderness to mid lower anterior chest.  No rash.  No crepitance or deformity. Abdominal:     Tenderness: There is no abdominal tenderness.  Musculoskeletal:     Right lower leg: No edema.     Left lower leg: No edema.  Neurological:     Mental Status: She is alert.     ED Results / Procedures / Treatments   Labs (all labs ordered are listed, but only abnormal results are displayed) Labs Reviewed  CBC - Abnormal; Notable for the following components:      Result Value   Hemoglobin 11.9 (*)    HCT 35.3 (*)    All other components within normal limits  BASIC METABOLIC PANEL  TROPONIN I (HIGH SENSITIVITY)    EKG EKG Interpretation  Date/Time:  Thursday October 06 2022 14:57:46 EDT Ventricular Rate:  55 PR Interval:  152 QRS Duration: 80 QT Interval:  430 QTC Calculation: 411 R Axis:   66 Text Interpretation: Sinus bradycardia Otherwise normal ECG No previous ECGs available Confirmed by Davonna Belling [phone]) on 10/06/2022 3:29:56 PM  Radiology DG Chest 2 View  Result Date: 10/06/2022 CLINICAL DATA:  Chest pain for 2 days. EXAM: CHEST - 2 VIEW COMPARISON:  None Available. FINDINGS: The heart size and mediastinal contours are within normal limits. Both lungs are clear. The visualized skeletal structures are unremarkable. IMPRESSION: No active cardiopulmonary disease. Electronically Signed   By: Zerita Boers M.D.   On: 10/06/2022 15:36    Procedures Procedures    Medications Ordered in ED Medications - No data to display  ED Course/ Medical Decision Making/ A&P                           Medical Decision Making Amount and/or Complexity of Data Reviewed Labs: ordered. Radiology: ordered.  Risk Prescription drug management.   Patient with chest pain.  Anterior chest.  Reproducible.  EKG reassuring.  Mild bradycardia.  Differential diagnosis includes chest wall pain cardiac disease, pneumonia, esophagitis.  Does have a hypertension but unknown if  related to the pain.  Will recheck.  We will get basic blood work and reevaluate.  Chest x-ray does not show clear cause of pain.  Troponin negative.  Blood work reassuring.  Potentially chest wall pain and that is so reproducible on the anterior chest.  Will treat symptomatically but appears stable for discharge home.        Final Clinical Impression(s) / ED Diagnoses Final diagnoses:  Chest wall pain    Rx / DC Orders ED Discharge Orders          Ordered    methocarbamol (ROBAXIN) 500 MG tablet  Every 8 hours PRN        10/06/22 1845    oxyCODONE-acetaminophen (PERCOCET/ROXICET) 5-325 MG tablet  Every 8 hours PRN        10/06/22 1845              Davonna Belling, MD 10/07/22 1447

## 2022-10-06 NOTE — ED Triage Notes (Signed)
CP x 2 days. Describes pain as "tight, pressure." Constant pain. Bending over makes it worse.

## 2022-10-06 NOTE — ED Notes (Signed)
Came into pt's room to discharge and administer medication. Pt immediately was asking about an excuse note for pt and pt's family member. This RN told pt a note was provided for the pt but not the family member. Pt declined medication and walked out. Did not stay to sign for discharge.

## 2022-10-10 ENCOUNTER — Ambulatory Visit (HOSPITAL_COMMUNITY)
Admission: RE | Admit: 2022-10-10 | Discharge: 2022-10-10 | Disposition: A | Discharge status: Home / Self Care | Payer: BC Managed Care – PPO | Source: Ambulatory Visit | Attending: Family Medicine | Admitting: Family Medicine

## 2022-10-10 DIAGNOSIS — Z1231 Encounter for screening mammogram for malignant neoplasm of breast: Secondary | ICD-10-CM | POA: Insufficient documentation

## 2022-10-31 ENCOUNTER — Inpatient Hospital Stay
Admission: RE | Admit: 2022-10-31 | Discharge: 2022-10-31 | Disposition: A | Discharge status: Home / Self Care | Payer: Self-pay | Source: Ambulatory Visit | Attending: Family Medicine | Admitting: Family Medicine

## 2022-10-31 ENCOUNTER — Other Ambulatory Visit: Payer: Self-pay | Admitting: Family Medicine

## 2022-10-31 DIAGNOSIS — Z1231 Encounter for screening mammogram for malignant neoplasm of breast: Secondary | ICD-10-CM

## 2022-11-16 IMAGING — DX DG HAND COMPLETE 3+V*L*
3 series · 3 of 3 positions shown · non-contrast
Comparison: None.

CLINICAL DATA: Dorsal hand pain

EXAM:
LEFT HAND - COMPLETE 3 VIEW

[hand pa]
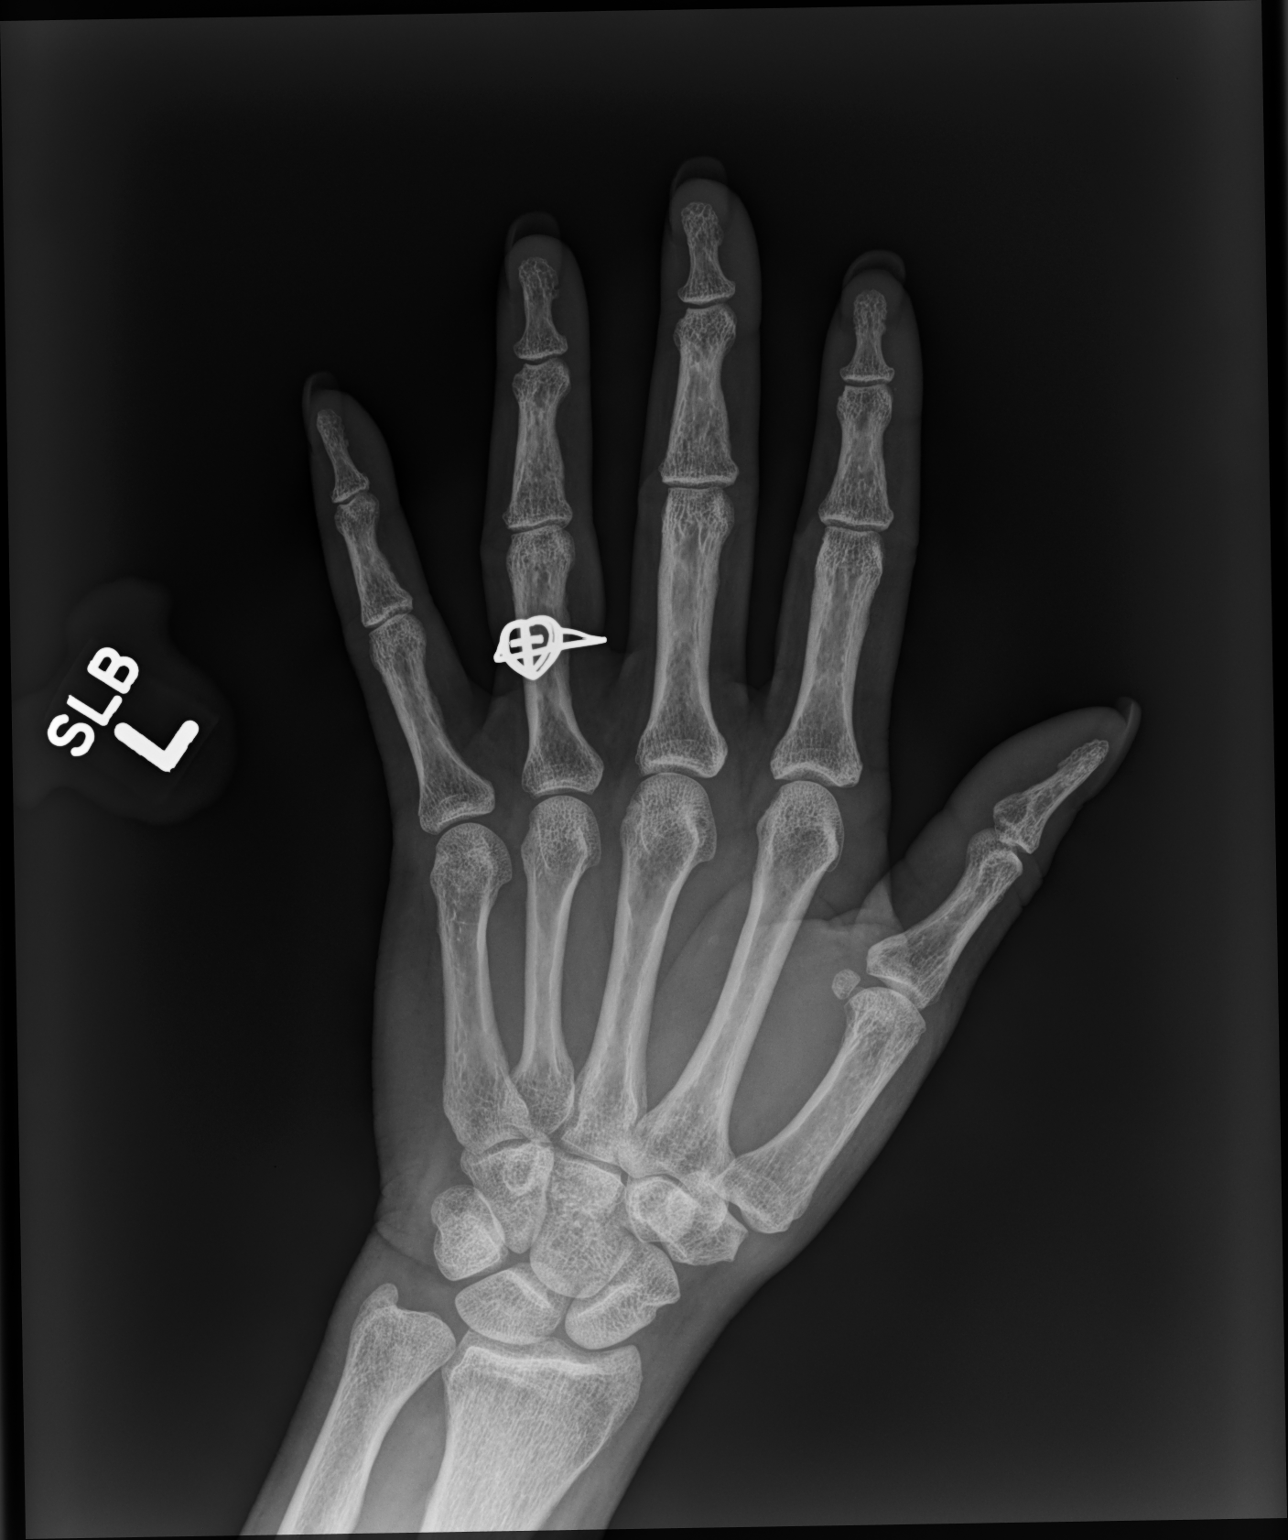

[hand mlo]
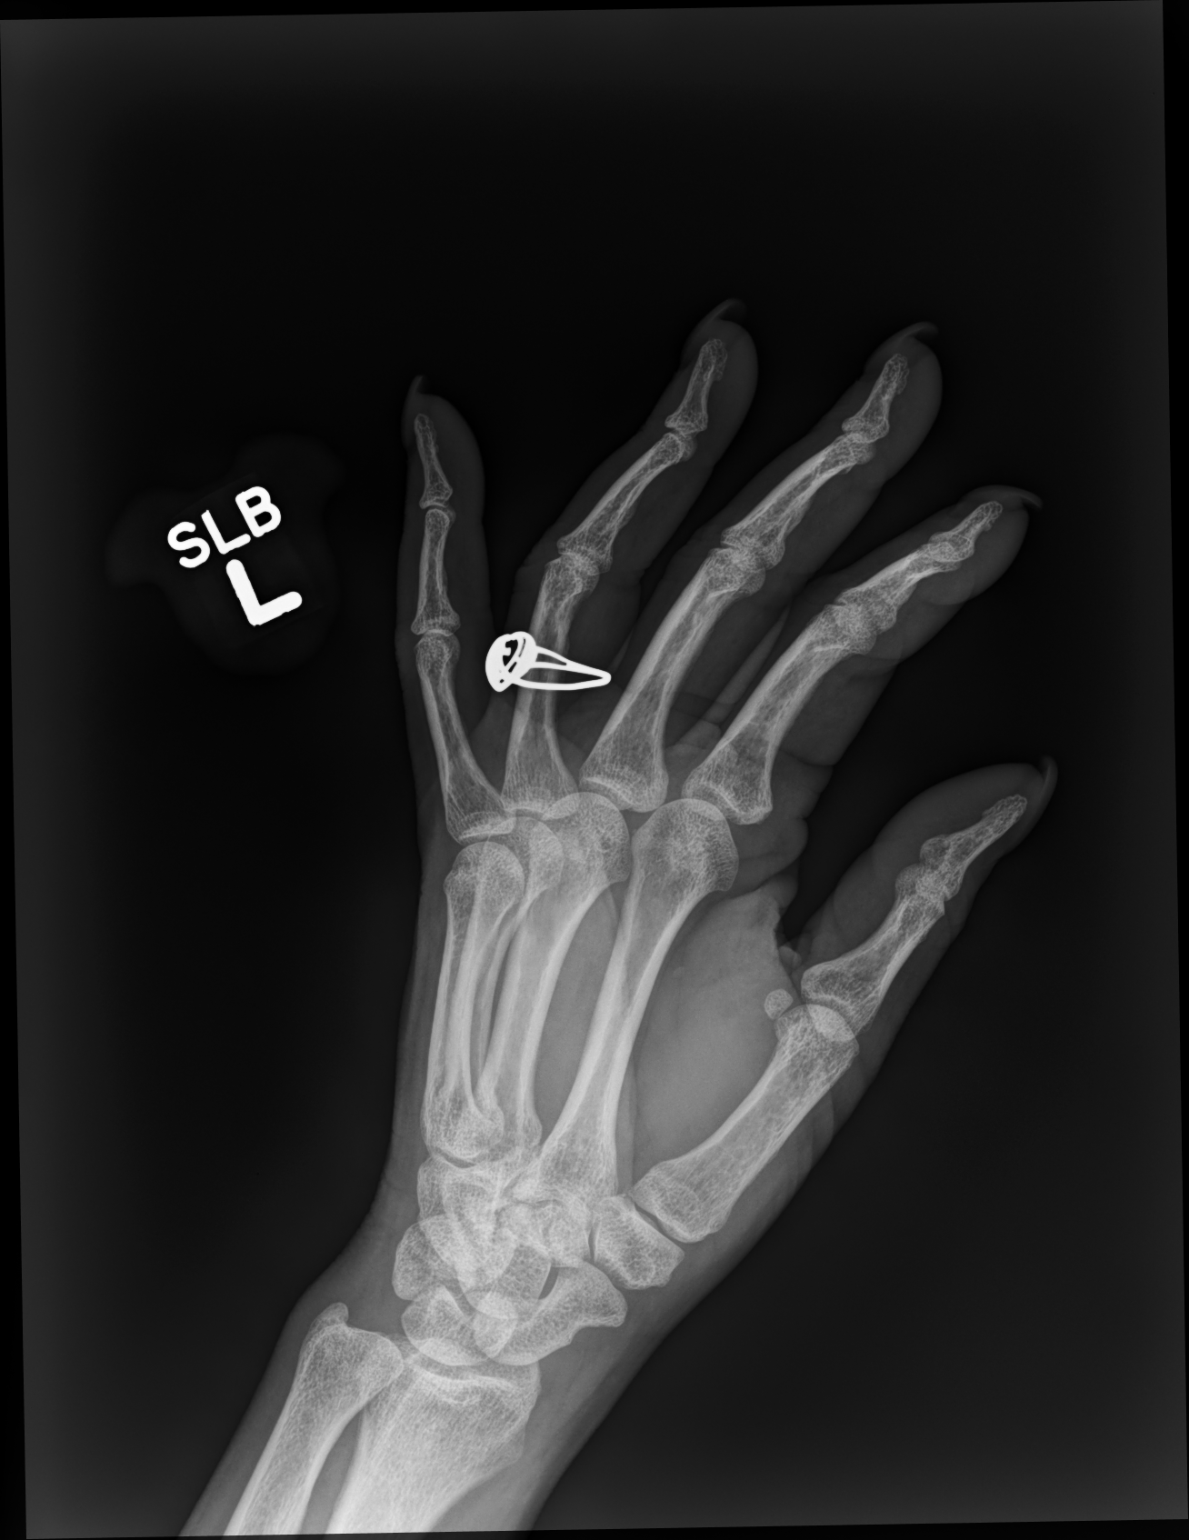

[hand lat]
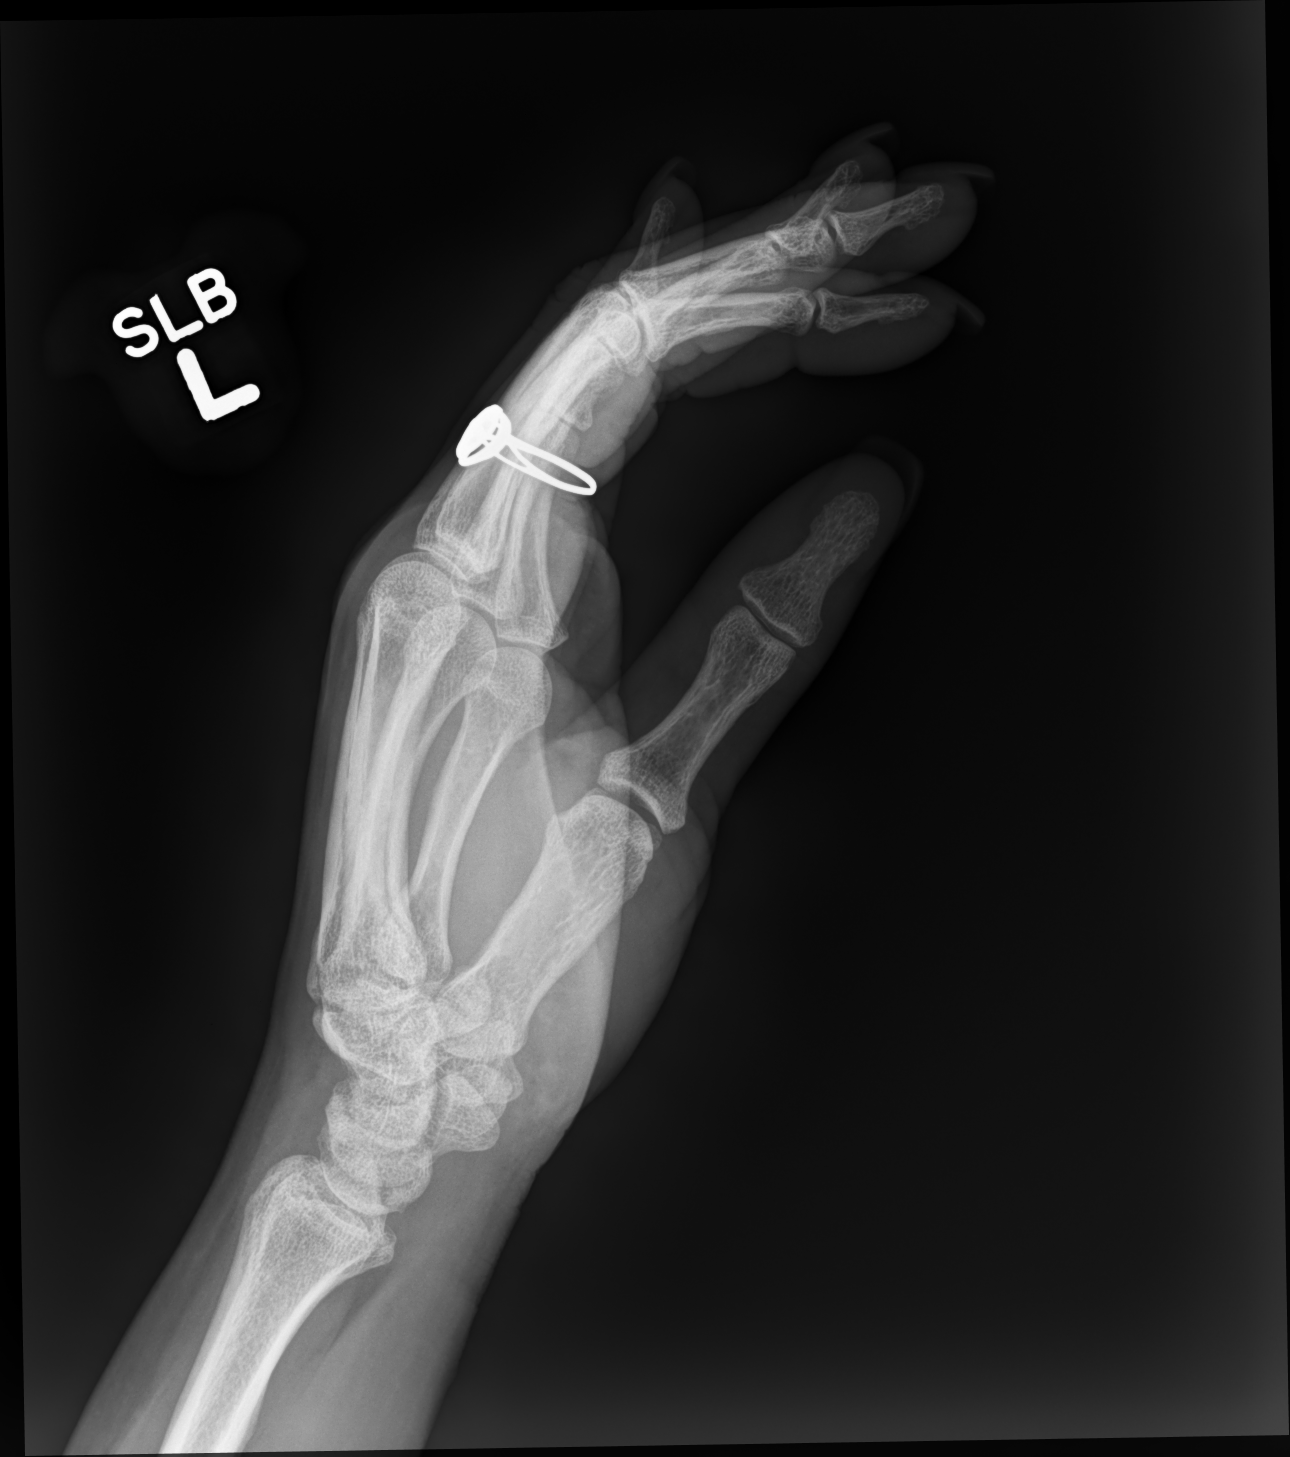

[3 of 3 positions shown; findings below may reference images not displayed]

FINDINGS: There is no evidence of fracture or dislocation. There is no
evidence of arthropathy or other focal bone abnormality. Soft
tissues are unremarkable.
IMPRESSION: Negative.

## 2022-11-24 ENCOUNTER — Encounter: Payer: Self-pay | Admitting: Emergency Medicine

## 2022-11-24 ENCOUNTER — Ambulatory Visit
Admission: EM | Admit: 2022-11-24 | Discharge: 2022-11-24 | Disposition: A | Discharge status: Home / Self Care | Payer: BC Managed Care – PPO | Attending: Nurse Practitioner | Admitting: Nurse Practitioner

## 2022-11-24 DIAGNOSIS — R21 Rash and other nonspecific skin eruption: Secondary | ICD-10-CM

## 2022-11-24 MED ORDER — TRIAMCINOLONE ACETONIDE 0.1 % EX CREA: 1.0000 | TOPICAL_CREAM | Freq: Two times a day (BID) | CUTANEOUS | 0 refills | Status: DC

## 2022-11-24 MED ORDER — DEXAMETHASONE SODIUM PHOSPHATE 10 MG/ML IJ SOLN
10.0000 mg | INTRAMUSCULAR | Status: AC
  Administered 2022-11-24: 10 mg via INTRAMUSCULAR

## 2022-11-24 MED ORDER — PREDNISONE 20 MG PO TABS: 40.0000 mg | ORAL_TABLET | Freq: Every day | ORAL | 0 refills | Status: AC

## 2022-11-24 NOTE — ED Provider Notes (Signed)
[facility]    CSN: [id number] Arrival date & time: 11/24/22  1737      History   Chief Complaint No chief complaint on file.   HPI April Ford is a 56 y.o. female.   The history is provided by the patient.   Patient presents for complaints of a she rash that started approximately 2 days ago.  Rash is located on her forearms, and on her neck.  Patient states that her symptoms started after she went to work, as she works with [employer], [employer].  She states that she was told at her job that there are mites in the boxes that may have bitten her.  She states that she did feel like something was biting her all over.  She states that she also changed her Dove body wash to a another type of the body wash, but this is the only thing she can think of.  She denies fever, chills, new medications, foods, or lotions.  She has been taking Zyrtec and Benadryl for her symptoms. Past Medical History:  Diagnosis Date   Bronchiolitis    COPD (chronic obstructive pulmonary disease) ([facility])     Patient Active Problem List   Diagnosis Date Noted   Trichomoniasis 09/13/2022   COPD (chronic obstructive pulmonary disease) ([facility]) 09/06/2022   Mixed hyperlipidemia 09/06/2022   Preventative health care 09/06/2022    Past Surgical History:  Procedure Laterality Date   ABDOMINAL HYSTERECTOMY      OB History   No obstetric history on file.      Home Medications    Prior to Admission medications   Medication Sig Start Date End Date Taking? Authorizing Provider  predniSONE (DELTASONE) 20 MG tablet Take 2 tablets (40 mg total) by mouth daily with breakfast for 5 days. 11/24/22 11/29/22 Yes Supriya Beaston-Warren, Sadie Haber, NP  triamcinolone cream (KENALOG) 0.1 % Apply 1 Application topically 2 (two) times daily. 11/24/22  Yes Mesha Schamberger-Warren, Sadie Haber, NP  cetirizine (ZYRTEC) 10 MG tablet Take 10 mg by mouth daily.    [provider]  methocarbamol (ROBAXIN) 500 MG tablet Take 1 tablet (500  mg total) by mouth every 8 (eight) hours as needed for muscle spasms. 10/06/22   Benjiman Core, MD  oxyCODONE-acetaminophen (PERCOCET/ROXICET) 5-325 MG tablet Take 1-2 tablets by mouth every 8 (eight) hours as needed for severe pain. 10/06/22   Benjiman Core, MD    Family History History reviewed. No pertinent family history.  Social History Social History   Tobacco Use   Smoking status: Never   Smokeless tobacco: Never  Vaping Use   Vaping Use: Every day   Substances: Flavoring  Substance Use Topics   Alcohol use: Not Currently   Drug use: Not Currently     Allergies   Patient has no known allergies.   Review of Systems Review of Systems Per HPI  Physical Exam Triage Vital Signs ED Triage Vitals  Enc Vitals Group     BP 11/24/22 1752 (!) 148/69     Pulse Rate 11/24/22 1752 66     Resp 11/24/22 1752 18     Temp 11/24/22 1752 98.7 F (37.1 C)     Temp Source 11/24/22 1752 Oral     SpO2 11/24/22 1752 98 %     Weight --      Height --      Head Circumference --      Peak Flow --      Pain Score 11/24/22 1753 0  Pain Loc --      Pain Edu? --      Excl. in GC? --    No data found.  Updated Vital Signs BP (!) 148/69 (BP Location: Right Arm)   Pulse 66   Temp 98.7 F (37.1 C) (Oral)   Resp 18   SpO2 98%   Visual Acuity Right Eye Distance:   Left Eye Distance:   Bilateral Distance:    Right Eye Near:   Left Eye Near:    Bilateral Near:     Physical Exam Vitals and nursing note reviewed.  Constitutional:      General: She is not in acute distress.    Appearance: Normal appearance.  HENT:     Head: Normocephalic.  Eyes:     Extraocular Movements: Extraocular movements intact.     Pupils: Pupils are equal, round, and reactive to light.  Pulmonary:     Effort: Pulmonary effort is normal.  Skin:    Findings: Rash present. Rash is macular and papular.     Comments: Maculopapular rash noted to the bilateral forearms and to her neck.  Rash  is in no congruent pattern, is slightly raised, and erythematous.  There is no oozing, fluctuance, or drainage present.  Neurological:     Mental Status: She is alert.  Psychiatric:        Mood and Affect: Mood normal.        Behavior: Behavior normal.      UC Treatments / Results  Labs (all labs ordered are listed, but only abnormal results are displayed) Labs Reviewed - No data to display  EKG   Radiology No results found.  Procedures Procedures (including critical care time)  Medications Ordered in UC Medications  dexamethasone (DECADRON) injection 10 mg (10 mg Intramuscular Given 11/24/22 1834)    Initial Impression / Assessment and Plan / UC Course  I have reviewed the triage vital signs and the nursing notes.  Pertinent labs & imaging results that were available during my care of the patient were reviewed by me and considered in my medical decision making (see chart for details).  Patient presents for complaints of rash that started approximately 2 days ago.  On exam, patient is well-appearing, she is in no acute distress, however though she is mildly hypertensive.  Patient with a rash of indiscernible origin at this time.  Patient reports that this may have been caused by mites at her job, and she also reports that she did change her body wash.  Will treat patient for a rash with nonspecific skin eruption.  Patient was given Decadron 10 mg in the clinic.  Will start patient on prednisone 40 mg, and triamcinolone cream to use to the localized areas of the rash.  Reported care recommendations were provided to the patient to include use of Benadryl at bedtime, use of Aveeno colloidal bath, and avoidance of hot baths or showers.  Patient verbalizes understanding.  All questions were answered.  Patient is stable for discharge. Final Clinical Impressions(s) / UC Diagnoses   Final diagnoses:  Rash and nonspecific skin eruption     Discharge Instructions      Take  medication as prescribed. May also take over-the-counter Zyrtec to help with itching. Avoid hot baths or showers while symptoms persist.  Recommend taking lukewarm baths. May apply cool cloths to the area to help with itching or discomfort. Avoid scratching, rubbing, or manipulating the areas while symptoms persist. Recommend Aveeno colloidal oatmeal bath to use  to help with drying and itching. Follow up if symptoms do not improve.      ED Prescriptions     Medication Sig Dispense Auth. Provider   predniSONE (DELTASONE) 20 MG tablet Take 2 tablets (40 mg total) by mouth daily with breakfast for 5 days. 10 tablet Ofilia Rayon-Warren, Sadie Haber, NP   triamcinolone cream (KENALOG) 0.1 % Apply 1 Application topically 2 (two) times daily. 45 g Bohdi Leeds-Warren, Sadie Haber, NP      PDMP not reviewed this encounter.   Abran Cantor, NP 11/24/22 1836

## 2022-11-24 NOTE — Discharge Instructions (Addendum)
Take medication as prescribed. May also take over-the-counter Zyrtec to help with itching. Avoid hot baths or showers while symptoms persist.  Recommend taking lukewarm baths. May apply cool cloths to the area to help with itching or discomfort. Avoid scratching, rubbing, or manipulating the areas while symptoms persist. Recommend Aveeno colloidal oatmeal bath to use to help with drying and itching. Follow up if symptoms do not improve.  

## 2022-11-24 NOTE — ED Triage Notes (Signed)
Itchy Rash on forearms and stomach since Tuesday.  States she works a lot with [employer] and thinks she may have gotten bug bite by the bugs that are in the boxes.

## 2023-01-15 ENCOUNTER — Emergency Department (HOSPITAL_COMMUNITY)
Admission: EM | Admit: 2023-01-15 | Discharge: 2023-01-15 | Disposition: A | Discharge status: Home / Self Care | Payer: BC Managed Care – PPO | Attending: Emergency Medicine | Admitting: Emergency Medicine

## 2023-01-15 ENCOUNTER — Encounter (HOSPITAL_COMMUNITY): Payer: Self-pay

## 2023-01-15 ENCOUNTER — Other Ambulatory Visit: Payer: Self-pay

## 2023-01-15 DIAGNOSIS — J449 Chronic obstructive pulmonary disease, unspecified: Secondary | ICD-10-CM | POA: Insufficient documentation

## 2023-01-15 DIAGNOSIS — R21 Rash and other nonspecific skin eruption: Secondary | ICD-10-CM

## 2023-01-15 MED ORDER — DIPHENHYDRAMINE HCL 25 MG PO CAPS
25.0000 mg | ORAL_CAPSULE | Freq: Once | ORAL | Status: AC
  Administered 2023-01-15: 25 mg via ORAL
  Filled 2023-01-15: qty 1

## 2023-01-15 MED ORDER — TRIAMCINOLONE ACETONIDE 0.1 % EX CREA: 1.0000 | TOPICAL_CREAM | Freq: Two times a day (BID) | CUTANEOUS | 2 refills | Status: DC

## 2023-01-15 MED ORDER — PREDNISONE 50 MG PO TABS
60.0000 mg | ORAL_TABLET | Freq: Once | ORAL | Status: AC
  Administered 2023-01-15: 60 mg via ORAL
  Filled 2023-01-15: qty 1

## 2023-01-15 MED ORDER — PREDNISONE 10 MG PO TABS: 40.0000 mg | ORAL_TABLET | Freq: Every day | ORAL | 0 refills | Status: AC

## 2023-01-15 NOTE — ED Provider Notes (Signed)
[facility] Provider Note   CSN: [id number] Arrival date & time: 01/15/23  2009     History  Chief Complaint  Patient presents with   Rash    April Ford is a 57 y.o. female.   Rash Patient presents for rash.  Medical history includes COPD, HLD.  Yesterday, she had onset of a diffuse pruritic rash.  She denies any recent medication changes, changes in her soaps or detergents.  She has not had any known exposures to insects or environmental allergens.  Rash is described as itchy bumps that are throughout her upper and lower extremities, back, chest, and neck.  She was seen for similar rash in November.  At the time, she was prescribed prednisone and triamcinolone cream.  This did resolve her symptoms.  Patient denies any current systemic symptoms.     Home Medications Prior to Admission medications   Medication Sig Start Date End Date Taking? Authorizing Provider  predniSONE (DELTASONE) 10 MG tablet Take 4 tablets (40 mg total) by mouth daily for 5 days. 01/15/23 01/20/23 Yes Godfrey Pick, MD  cetirizine (ZYRTEC) 10 MG tablet Take 10 mg by mouth daily.    [provider]  methocarbamol (ROBAXIN) 500 MG tablet Take 1 tablet (500 mg total) by mouth every 8 (eight) hours as needed for muscle spasms. 10/06/22   Davonna Belling, MD  oxyCODONE-acetaminophen (PERCOCET/ROXICET) 5-325 MG tablet Take 1-2 tablets by mouth every 8 (eight) hours as needed for severe pain. 10/06/22   Davonna Belling, MD  triamcinolone cream (KENALOG) 0.1 % Apply 1 Application topically 2 (two) times daily. 01/15/23   Godfrey Pick, MD      Allergies    Patient has no known allergies.    Review of Systems   Review of Systems  Skin:  Positive for rash.    Physical Exam Updated Vital Signs BP (!) 121/93 (BP Location: Right Arm)   Pulse 60   Temp 98.4 F (36.9 C) (Oral)   Resp 16   Ht 5\' 7"  (1.702 m)   Wt 81.2 kg   SpO2 100%   BMI 28.04 kg/m   Physical Exam Vitals and nursing note reviewed.  Constitutional:      General: She is not in acute distress.    Appearance: Normal appearance. She is well-developed. She is not ill-appearing, toxic-appearing or diaphoretic.  HENT:     Head: Normocephalic and atraumatic.     Right Ear: External ear normal.     Left Ear: External ear normal.     Nose: Nose normal.     Mouth/Throat:     Mouth: Mucous membranes are moist.  Eyes:     Extraocular Movements: Extraocular movements intact.     Conjunctiva/sclera: Conjunctivae normal.  Cardiovascular:     Rate and Rhythm: Normal rate and regular rhythm.  Pulmonary:     Effort: Pulmonary effort is normal. No respiratory distress.  Abdominal:     General: There is no distension.     Palpations: Abdomen is soft.     Tenderness: There is no abdominal tenderness.  Musculoskeletal:        General: No swelling. Normal range of motion.     Cervical back: Normal range of motion and neck supple.     Right lower leg: No edema.     Left lower leg: No edema.  Skin:    General: Skin is warm and dry.     Capillary Refill: Capillary refill takes less than  2 seconds.     Findings: Rash (Diffuse papulopustular rash, no erythema, no tenderness, no drainage) present.  Neurological:     General: No focal deficit present.     Mental Status: She is alert and oriented to person, place, and time.     Cranial Nerves: No cranial nerve deficit.     Sensory: No sensory deficit.     Motor: No weakness.     Coordination: Coordination normal.  Psychiatric:        Mood and Affect: Mood normal.        Behavior: Behavior normal.        Thought Content: Thought content normal.        Judgment: Judgment normal.     ED Results / Procedures / Treatments   Labs (all labs ordered are listed, but only abnormal results are displayed) Labs Reviewed - No data to display  EKG None  Radiology No results found.  Procedures Procedures    Medications Ordered in  ED Medications  predniSONE (DELTASONE) tablet 60 mg (has no administration in time range)  diphenhydrAMINE (BENADRYL) capsule 25 mg (has no administration in time range)    ED Course/ Medical Decision Making/ A&P                             Medical Decision Making Risk Prescription drug management.   Patient presenting for onset of diffuse papulopustular, pruritic rash yesterday.  Vital signs are normal on arrival.  Patient is well-appearing on exam.  She does have nontender, diffuse rash consistent with folliculitis.  Alternative etiologies include insect bites, dermatitis piriformis, contact dermatitis, pancreatitis pilaris.  She states that she had a similar rash in November.  Per chart review, she was treated with oral steroids and triamcinolone cream.  She denies any recent new exposures to medications or environmental allergens.  Given her improvement with steroid therapy for similar rash in the past, patient was given prednisone here in the ED and prescribed continued course over the neck several days.  Triamcinolone cream as prescribed.  Dose of Benadryl was given for symptomatic relief.  Patient is stable for discharge with PCP and/or dermatology follow-up.        Final Clinical Impression(s) / ED Diagnoses Final diagnoses:  Rash and nonspecific skin eruption    Rx / DC Orders ED Discharge Orders          Ordered    triamcinolone cream (KENALOG) 0.1 %  2 times daily        01/15/23 2144    predniSONE (DELTASONE) 10 MG tablet  Daily        01/15/23 2144              Godfrey Pick, MD 01/15/23 2207

## 2023-01-15 NOTE — Discharge Instructions (Addendum)
Prescriptions for oral steroids and steroid cream were sent to your pharmacy.  Take these as prescribed.  Take Benadryl for relief of itching.  If you do not have improvement, there is a telephone number below to call to set up a follow-up appointment with a dermatologist.

## 2023-01-15 NOTE — ED Triage Notes (Signed)
Pt reports small fluid filled bumps that itch all over her body.  Pt had same rash a few months ago that went away with a cream.

## 2023-01-16 ENCOUNTER — Telehealth: Payer: Self-pay

## 2023-01-16 ENCOUNTER — Ambulatory Visit: Payer: BC Managed Care – PPO | Admitting: Family Medicine

## 2023-01-16 DIAGNOSIS — Z599 Problem related to housing and economic circumstances, unspecified: Secondary | ICD-10-CM

## 2023-01-16 NOTE — Telephone Encounter (Signed)
Transition Care Management Follow-up Telephone Call Date of discharge and from where: 01/15/23 [facility] How have you been since you were released from the hospital? Little relief as just picked up cream and applied Any questions or concerns? No  Items Reviewed: Did the pt receive and understand the discharge instructions provided? Yes  Medications obtained and verified? Yes  Other? No  Any new allergies since your discharge? No  Dietary orders reviewed? No Do you have support at home? Yes   Functional Questionnaire: (I = Independent and D = Dependent) ADLs: I  Bathing/Dressing- I  Meal Prep- I  Eating- I  Maintaining continence- I  Transferring/Ambulation- I  Managing Meds- I  Follow up appointments reviewed:  PCP Hospital f/u appt confirmed? No  pt had appt at 0930 this morning but says she called and spoke w/staff relaying she did not have the co-pay:thus appt cancelled. Pt says will use the cream and has been using Benadryl for itching. [facility] f/u appt confirmed? No   Are transportation arrangements needed? No  If their condition worsens, is the pt aware to call PCP or go to the Emergency Dept.? Yes Was the patient provided with contact information for the PCP's office or ED? Yes Was to pt encouraged to call back with questions or concerns? Yes

## 2023-01-16 NOTE — Addendum Note (Signed)
Addended by: Roger Shelter on: 01/16/2023 03:02 PM   Modules accepted: Orders

## 2023-01-19 ENCOUNTER — Telehealth: Payer: Self-pay

## 2023-01-19 NOTE — Patient Outreach (Signed)
  Care Coordination   Resource Consult  Visit Note   01/19/2023 Name: Julietta Mckenny MRN: [id number] DOB: Vaida 11, 1967  Alta Bias is a 57 y.o. year old female who sees Coral Spikes, DO for primary care. I spoke with  Paulena Mizuno by phone today.  What matters to the patients health and wellness today?  I would like housing resources    Goals Addressed             This Visit's Progress    COMPLETED: Care Coordination Activities       Care Coordination Interventions: Referral received to assist patient with medical copayment amount in order to attend a follow up ED visit Advised the patient that unfortunately there are no financial resources to assist with covering copay costs Discussed the patient is currently living with her daughter and she would like assistance with housing resources Mailed the patient a list of [place] resources Advised the patient to visit [place] [address] or call 211 from her mobile device as needed for help navigating resource needs         SDOH assessments and interventions completed:  No     Care Coordination Interventions:  Yes, provided   Follow up plan: No further intervention required. Patient is not eligible for ongoing long term care coordination follow up.    Encounter Outcome:  Pt. Visit Completed   Daneen Schick, BSW, CDP Social Worker, Certified Dementia Practitioner [facility]  Care Coordination [phone]

## 2023-01-19 NOTE — Patient Instructions (Signed)
Visit Information  Thank you for taking time to visit with me today. Please don't hesitate to contact me if I can be of assistance to you.   Following are the goals we discussed today:   Goals Addressed             This Visit's Progress    COMPLETED: Care Coordination Activities       Care Coordination Interventions: Referral received to assist patient with medical copayment amount in order to attend a follow up ED visit Advised the patient that unfortunately there are no financial resources to assist with covering copay costs Discussed the patient is currently living with her daughter and she would like assistance with housing resources Mailed the patient a list of [place] resources Advised the patient to visit [place] [address] or call 211 from her mobile device as needed for help navigating resource needs          If you are experiencing a Mental Health or [place] or need someone to talk to, please call 911   Patient verbalizes understanding of instructions and care plan provided today and agrees to view in [facility]. Active MyChart status and patient understanding of how to access instructions and care plan via MyChart confirmed with patient.     No further follow up required: Please contact your primary care provider as needed.  Daneen Schick, BSW, CDP Social Worker, Certified Dementia Practitioner [facility]  Care Coordination [phone]

## 2023-01-26 ENCOUNTER — Telehealth: Payer: Self-pay

## 2023-01-26 NOTE — Patient Outreach (Signed)
  Care Coordination   Consult  Visit Note   01/26/2023 Name: April Ford MRN: [id number] DOB: 1966-02-18  April Ford is a 57 y.o. year old female who sees Coral Spikes, DO for primary care. I  spoke with patient who indicates she is having difficulty securing her own place due to her past. Patient reports she is unable to pass a background check due to things committed prior to the year 2000. Advised the patient to contact [place] legal aid. Placed referral to [employer].  SDOH assessments and interventions completed:  No     Care Coordination Interventions:  Yes, provided   Follow up plan: No further intervention required. Patient is not eligible for ongoing care coordination follow up at this time.    Encounter Outcome:  Pt. Visit Completed   Daneen Schick, BSW, CDP Social Worker, Certified Dementia Practitioner [facility]  Care Coordination [phone]

## 2023-02-13 ENCOUNTER — Ambulatory Visit: Payer: BC Managed Care – PPO | Admitting: Family Medicine

## 2023-02-13 ENCOUNTER — Encounter: Payer: Self-pay | Admitting: Family Medicine

## 2023-02-13 VITALS — BP 108/70 | HR 70 | Temp 98.2°F | Ht 67.0 in | Wt 190.0 lb

## 2023-02-13 DIAGNOSIS — Z87891 Personal history of nicotine dependence: Secondary | ICD-10-CM | POA: Diagnosis not present

## 2023-02-13 DIAGNOSIS — J449 Chronic obstructive pulmonary disease, unspecified: Secondary | ICD-10-CM | POA: Diagnosis not present

## 2023-02-13 MED ORDER — BREZTRI AEROSPHERE 160-9-4.8 MCG/ACT IN AERO: 2.0000 | INHALATION_SPRAY | Freq: Two times a day (BID) | RESPIRATORY_TRACT | 11 refills | Status: DC

## 2023-02-13 MED ORDER — ALBUTEROL SULFATE HFA 108 (90 BASE) MCG/ACT IN AERS: 2.0000 | INHALATION_SPRAY | Freq: Four times a day (QID) | RESPIRATORY_TRACT | 3 refills | Status: DC | PRN

## 2023-02-13 NOTE — Progress Notes (Signed)
Subjective:  Patient ID: April Ford, female    DOB: 1966/06/27  Age: 57 y.o. MRN: [id number]  CC: Chief Complaint  Patient presents with   FMLA for COPD    HPI:  57 year old female former smoker with COPD presents for evaluation of the above.  Patient reports that she smoked cigarettes since the age of 39.  She quit 2 years ago.  She has smoked at least half pack per day during that time.  Patient meets criteria for CT lung cancer screening.  She is amenable to this.  Patient reports that she is having issues with her COPD.  She is having issues with breath particularly with significant physical activity.  She has trouble with lifting at work.  Patient is interested in intermittent FMLA to be used for flares and also restrictions at her she would like to discuss this today.  Additionally, patient feels like her COPD is worsening and that she now needs inhalers.  She is requesting medication today.  Patient Active Problem List   Diagnosis Date Noted   Former smoker 02/13/2023   COPD (chronic obstructive pulmonary disease) ([place]) 09/06/2022   Mixed hyperlipidemia 09/06/2022   Preventative health care 09/06/2022    Social Hx   Social History   Socioeconomic History   Marital status: Single    Spouse name: Not on file   Number of children: Not on file   Years of education: Not on file   Highest education level: Not on file  Occupational History   Not on file  Tobacco Use   Smoking status: Former    Years: 41.00    Types: Cigarettes    Quit date: 06/23/2021    Years since quitting: 1.6   Smokeless tobacco: Never  Vaping Use   Vaping Use: Every day   Start date: 02/23/2022   Substances: Flavoring  Substance and Sexual Activity   Alcohol use: Not Currently   Drug use: Not Currently   Sexual activity: Not Currently    Birth control/protection: None  Other Topics Concern   Not on file  Social History Narrative   Not on file   Social Determinants of Health   Financial  Resource Strain: Not on file  Food Insecurity: Not on file  Transportation Needs: Not on file  Physical Activity: Not on file  Stress: Not on file  Social Connections: Not on file    Review of Systems Per HPI  Objective:  BP 108/70   Pulse 70   Temp 98.2 F (36.8 C)   Ht 5' 7"$  (1.702 m)   Wt 190 lb (86.2 kg)   SpO2 96%   BMI 29.76 kg/m      02/13/2023   11:17 AM 01/15/2023   10:45 PM 01/15/2023    8:22 PM  BP/Weight  Systolic BP 123XX123 [id number]  Diastolic BP 70 88 93  Wt. (Lbs) 190  179  BMI 29.76 kg/m2  28.04 kg/m2    Physical Exam Vitals and nursing note reviewed.  Constitutional:      General: She is not in acute distress.    Appearance: Normal appearance.  HENT:     Head: Normocephalic and atraumatic.  Cardiovascular:     Rate and Rhythm: Normal rate and regular rhythm.  Pulmonary:     Effort: Pulmonary effort is normal.     Breath sounds: Normal breath sounds. No wheezing or rales.  Neurological:     Mental Status: She is alert.  Psychiatric:  Mood and Affect: Mood normal.        Behavior: Behavior normal.     Lab Results  Component Value Date   WBC 9.2 10/06/2022   HGB 11.9 (L) 10/06/2022   HCT 35.3 (L) 10/06/2022   PLT 213 10/06/2022   GLUCOSE 91 10/06/2022   CHOL 184 09/05/2022   TRIG 192 (H) 09/05/2022   HDL 48 09/05/2022   LDLCALC 103 (H) 09/05/2022   ALT 8 09/05/2022   AST 16 09/05/2022   NA 141 10/06/2022   K 3.7 10/06/2022   CL 108 10/06/2022   CREATININE 0.98 10/06/2022   BUN 17 10/06/2022   CO2 24 10/06/2022     Assessment & Plan:   Problem List Items Addressed This Visit       Respiratory   COPD (chronic obstructive pulmonary disease) ([place]) - Primary    Uncontrolled at this time.  Starting on albuterol and Breztri. FMLA filled out. Placing referral for CT lung cancer screening.      Relevant Medications   albuterol (VENTOLIN HFA) 108 (90 Base) MCG/ACT inhaler   Budeson-Glycopyrrol-Formoterol (BREZTRI AEROSPHERE)  160-9-4.8 MCG/ACT AERO     Other   Former smoker   Relevant Orders   Ambulatory Referral Lung Cancer Screening [facility]    Meds ordered this encounter  Medications   albuterol (VENTOLIN HFA) 108 (90 Base) MCG/ACT inhaler    Sig: Inhale 2 puffs into the lungs every 6 (six) hours as needed for wheezing or shortness of breath.    Dispense:  8 g    Refill:  3   Budeson-Glycopyrrol-Formoterol (BREZTRI AEROSPHERE) 160-9-4.8 MCG/ACT AERO    Sig: Inhale 2 puffs into the lungs 2 (two) times daily.    Dispense:  10.7 g    Refill:  11    Follow-up:  Return in about 6 months (around 08/14/2023).  [place]

## 2023-02-13 NOTE — Patient Instructions (Signed)
I will fax over documentation.  We will arrange CT lung cancer screening.  Inhalers sent.  Take care  Dr. Lacinda Axon

## 2023-02-13 NOTE — Assessment & Plan Note (Addendum)
Uncontrolled at this time.  Starting on albuterol and Breztri. FMLA filled out. Placing referral for CT lung cancer screening.

## 2023-03-13 ENCOUNTER — Other Ambulatory Visit: Payer: Self-pay | Admitting: Family Medicine

## 2023-03-13 ENCOUNTER — Telehealth: Payer: Self-pay | Admitting: Family Medicine

## 2023-03-13 MED ORDER — METRONIDAZOLE 500 MG PO TABS: 500.0000 mg | ORAL_TABLET | Freq: Two times a day (BID) | ORAL | 0 refills | Status: AC

## 2023-03-13 NOTE — Telephone Encounter (Signed)
Patient is requesting refill  medication for vaginal discharge sent to [facility]

## 2023-03-13 NOTE — Telephone Encounter (Signed)
Cook, [name], DO     Done   

## 2023-03-13 NOTE — Telephone Encounter (Signed)
Patient notified

## 2023-05-21 ENCOUNTER — Other Ambulatory Visit: Payer: Self-pay

## 2023-05-21 ENCOUNTER — Encounter (HOSPITAL_COMMUNITY): Payer: Self-pay | Admitting: Emergency Medicine

## 2023-05-21 ENCOUNTER — Emergency Department (HOSPITAL_COMMUNITY)
Admission: EM | Admit: 2023-05-21 | Discharge: 2023-05-21 | Disposition: A | Discharge status: Home / Self Care | Payer: BC Managed Care – PPO | Attending: Emergency Medicine | Admitting: Emergency Medicine

## 2023-05-21 DIAGNOSIS — R1084 Generalized abdominal pain: Secondary | ICD-10-CM | POA: Insufficient documentation

## 2023-05-21 LAB — URINALYSIS, ROUTINE W REFLEX MICROSCOPIC
Bilirubin Urine: NEGATIVE
Glucose, UA: NEGATIVE mg/dL
Hgb urine dipstick: NEGATIVE
Ketones, ur: NEGATIVE mg/dL
Leukocytes,Ua: NEGATIVE
Nitrite: NEGATIVE
Protein, ur: NEGATIVE mg/dL
Specific Gravity, Urine: 1.018 (ref 1.005–1.030)
pH: 5 (ref 5.0–8.0)

## 2023-05-21 LAB — COMPREHENSIVE METABOLIC PANEL
ALT: 10 U/L (ref 0–44)
AST: 18 U/L (ref 15–41)
Albumin: 3.9 g/dL (ref 3.5–5.0)
Alkaline Phosphatase: 72 U/L (ref 38–126)
Anion gap: 13 (ref 5–15)
BUN: 15 mg/dL (ref 6–20)
CO2: 21 mmol/L — ABNORMAL LOW (ref 22–32)
Calcium: 9 mg/dL (ref 8.9–10.3)
Chloride: 104 mmol/L (ref 98–111)
Creatinine, Ser: 1 mg/dL (ref 0.44–1.00)
GFR, Estimated: >60 mL/min (ref >60)
Glucose, Bld: 102 mg/dL — ABNORMAL HIGH (ref 70–99)
Potassium: 4.1 mmol/L (ref 3.5–5.1)
Sodium: 138 mmol/L (ref 135–145)
Total Bilirubin: 1.2 mg/dL (ref 0.3–1.2)
Total Protein: 7.3 g/dL (ref 6.5–8.1)

## 2023-05-21 LAB — CBC
HCT: 36.9 % (ref 36.0–46.0)
Hemoglobin: 12.4 g/dL (ref 12.0–15.0)
MCH: 30.6 pg (ref 26.0–34.0)
MCHC: 33.6 g/dL (ref 30.0–36.0)
MCV: 91.1 fL (ref 80.0–100.0)
Platelets: 198 10*3/uL (ref 150–400)
RBC: 4.05 MIL/uL (ref 3.87–5.11)
RDW: 12.8 % (ref 11.5–15.5)
WBC: 9.9 10*3/uL (ref 4.0–10.5)
nRBC: 0 % (ref 0.0–0.2)

## 2023-05-21 LAB — LIPASE, BLOOD: Lipase: 26 U/L (ref 11–51)

## 2023-05-21 MED ORDER — ONDANSETRON HCL 4 MG PO TABS: 4.0000 mg | ORAL_TABLET | Freq: Four times a day (QID) | ORAL | 0 refills | Status: DC

## 2023-05-21 MED ORDER — SODIUM CHLORIDE 0.9 % IV BOLUS
500.0000 mL | Freq: Once | INTRAVENOUS | Status: AC
  Administered 2023-05-21: 500 mL via INTRAVENOUS

## 2023-05-21 MED ORDER — ONDANSETRON HCL 4 MG/2ML IJ SOLN
4.0000 mg | Freq: Once | INTRAMUSCULAR | Status: AC
  Administered 2023-05-21: 4 mg via INTRAVENOUS
  Filled 2023-05-21: qty 2

## 2023-05-21 NOTE — ED Triage Notes (Signed)
Pt c/o generalized severe abdominal pain since yesterday afternoon, stating at times she has been doubled over in pain and that it feels like her stomach cramps up and then releases. Currently rates pain 8/10 intermittent. Pt reports nausea and mild lightheadedness, no vomiting or diarrhea. No meds PTA

## 2023-05-21 NOTE — ED Provider Notes (Signed)
[place] EMERGENCY DEPARTMENT AT [facility] Provider Note   CSN: [id number] Arrival date & time: 05/21/23  1640     History  Chief Complaint  Patient presents with   Abdominal Pain    April Ford is a 57 y.o. female with history of marijuana use presented with generalized abdominal pain for the past day.  Patient states she was going to work when she started noticing this generalized pain and was nauseous but did not have any episodes of emesis.  Patient does not eat for fear of exacerbating her pain.  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  Patient states the pain does not radiate and does not have any flank pain.  Patient denies any dysuria or hematuria.  Patient does state that she smokes 1 blunt of marijuana a day but no other drug use.  Patient has been able to drink since symptoms began.  Patient denies any significant past medical history does not take medications.  Patient denied chest pain, shortness of breath, change sensations motor skills, LOC, headache, vision change, neck pain  Home Medications Prior to Admission medications   Medication Sig Start Date End Date Taking? Authorizing Provider  ondansetron (ZOFRAN) 4 MG tablet Take 1 tablet (4 mg total) by mouth every 6 (six) hours. 05/21/23  Yes Nonie Lochner, Beverly Gust, PA-C  albuterol (VENTOLIN HFA) 108 (90 Base) MCG/ACT inhaler Inhale 2 puffs into the lungs every 6 (six) hours as needed for wheezing or shortness of breath. 02/13/23   Tommie Sams, DO  Budeson-Glycopyrrol-Formoterol (BREZTRI AEROSPHERE) 160-9-4.8 MCG/ACT AERO Inhale 2 puffs into the lungs 2 (two) times daily. 02/13/23   Tommie Sams, DO  cetirizine (ZYRTEC) 10 MG tablet Take 10 mg by mouth daily.    [provider]      Allergies    Patient has no known allergies.    Review of Systems   Review of Systems  Gastrointestinal:  Positive for abdominal pain.    Physical Exam Updated  Vital Signs BP 99/61 (BP Location: Right Arm)   Pulse 62   Temp 98.9 F (37.2 C) (Oral)   Resp 14   Ht 5\' 7"  (1.702 m)   Wt 83.9 kg   SpO2 98%   BMI 28.98 kg/m  Physical Exam Vitals reviewed.  Constitutional:      General: She is not in acute distress. HENT:     Head: Normocephalic and atraumatic.  Eyes:     Extraocular Movements: Extraocular movements intact.     Conjunctiva/sclera: Conjunctivae normal.     Pupils: Pupils are equal, round, and reactive to light.  Cardiovascular:     Rate and Rhythm: Normal rate and regular rhythm.     Pulses: Normal pulses.     Heart sounds: Normal heart sounds.     Comments: 2+ bilateral radial/dorsalis pedis pulses with regular rate Pulmonary:     Effort: Pulmonary effort is normal. No respiratory distress.     Breath sounds: Normal breath sounds.  Abdominal:     Palpations: Abdomen is soft.     Tenderness: There is no abdominal tenderness. There is no guarding or rebound.  Musculoskeletal:        General: Normal range of motion.     Cervical back: Normal range of motion and neck supple.     Comments: 5 out of 5 bilateral grip/leg extension strength  Skin:    General: Skin is warm and dry.  Capillary Refill: Capillary refill takes less than 2 seconds.  Neurological:     General: No focal deficit present.     Mental Status: She is alert and oriented to person, place, and time.     Comments: Sensation intact in all 4 limbs  Psychiatric:        Mood and Affect: Mood normal.     ED Results / Procedures / Treatments   Labs (all labs ordered are listed, but only abnormal results are displayed) Labs Reviewed  COMPREHENSIVE METABOLIC PANEL - Abnormal; Notable for the following components:      Result Value   CO2 21 (*)    Glucose, Bld 102 (*)    All other components within normal limits  LIPASE, BLOOD  CBC  URINALYSIS, ROUTINE W REFLEX MICROSCOPIC    EKG None  Radiology No results found.  Procedures Procedures     Medications Ordered in ED Medications  sodium chloride 0.9 % bolus 500 mL (500 mLs Intravenous New Bag/Given 05/21/23 1752)  ondansetron (ZOFRAN) injection 4 mg (4 mg Intravenous Given 05/21/23 1751)    ED Course/ Medical Decision Making/ A&P                             Medical Decision Making Amount and/or Complexity of Data Reviewed Labs: ordered.   April Ford 56 y.o. presented today for generalized abdominal pain. Working DDx that I considered at this time includes, but not limited to, viral illness, gastroenteritis, colitis, small bowel obstruction, appendicitis, cholecystitis, pancreatitis, nephrolithiasis, AAA, UTI, pyelonephritis.  R/o DDx: gastroenteritis, colitis, small bowel obstruction, appendicitis, cholecystitis, pancreatitis, nephrolithiasis, AAA, UTI, pyelonephritis: These are considered less likely due to history of present illness and physical exam findings.  Review of prior external notes: 01/14/2023 ED provider  Unique Tests and My Interpretation:  CBC with differential: Unremarkable CMP: Unremarkable Lipase: Unremarkable UA: Unremarkable   Discussion with Independent Historian: None  Discussion of Management of Tests: None  Risk: Medium: prescription drug management  Risk Stratification Score: None  Staffed with Rhunette Croft, MD   Plan: Patient presented for abdominal pain. On exam patient was in no acute distress and stable vitals.  Patient blood pressure was a little soft at 99/61 the patient was given 500 mL of fluid.  Patient's physical exam was unremarkable she did not have any abdominal tenderness or peritoneal signs noted.  Patient states that she is still felt nauseous and so patient will be given Zofran.  Pending labs patient may be discharged with primary care follow-up.  Patient stable at this time.  Patient's labs came back reassuring.  Patient blood pressure did improve to 116/61 in the room after receiving fluids.  Patient's repeat  abdominal exam was still unremarkable.  I suspect patient may have viral illness.  I spoke to the attending we both agreed that patient does not need CT at this time as her repeat abdominal exam was reassuring and her labs are reassuring.  Patient be given Zofran with outpatient follow-up.  I spoke to the patient about how she may use Tylenol every 6 hours as needed for pain and the importance of remaining hydrated and eating food as tolerated  Patient was given return precautions.patient stable for discharge at this time.  Patient verbalized understanding of plan.         Final Clinical Impression(s) / ED Diagnoses Final diagnoses:  Generalized abdominal pain    Rx / DC Orders ED Discharge Orders  Ordered    ondansetron (ZOFRAN) 4 MG tablet  Every 6 hours        05/21/23 1839              Remi Deter 05/21/23 1844    Derwood Kaplan, MD 05/26/23 1944

## 2023-05-21 NOTE — Discharge Instructions (Signed)
Please follow-up with your primary care provider regarding recent symptoms and ER visit.  You will be prescribed Zofran for your nausea.  Today your labs and physical exam are reassuring and you may have a viral illness which would explain your symptoms.  Please remain hydrated and eat food as tolerated.  If symptoms worsen please return to ER.

## 2023-05-22 ENCOUNTER — Emergency Department (HOSPITAL_COMMUNITY): Payer: Self-pay

## 2023-05-22 ENCOUNTER — Encounter (HOSPITAL_COMMUNITY): Payer: Self-pay | Admitting: Emergency Medicine

## 2023-05-22 ENCOUNTER — Emergency Department (HOSPITAL_COMMUNITY)
Admission: EM | Admit: 2023-05-22 | Discharge: 2023-05-23 | Disposition: A | Discharge status: Home / Self Care | Payer: Self-pay | Attending: Emergency Medicine | Admitting: Emergency Medicine

## 2023-05-22 ENCOUNTER — Other Ambulatory Visit: Payer: Self-pay

## 2023-05-22 DIAGNOSIS — R112 Nausea with vomiting, unspecified: Secondary | ICD-10-CM | POA: Insufficient documentation

## 2023-05-22 DIAGNOSIS — R1031 Right lower quadrant pain: Secondary | ICD-10-CM | POA: Insufficient documentation

## 2023-05-22 DIAGNOSIS — R140 Abdominal distension (gaseous): Secondary | ICD-10-CM | POA: Insufficient documentation

## 2023-05-22 DIAGNOSIS — R1084 Generalized abdominal pain: Secondary | ICD-10-CM

## 2023-05-22 LAB — COMPREHENSIVE METABOLIC PANEL
ALT: 11 U/L (ref 0–44)
AST: 17 U/L (ref 15–41)
Albumin: 3.8 g/dL (ref 3.5–5.0)
Alkaline Phosphatase: 68 U/L (ref 38–126)
Anion gap: 13 (ref 5–15)
BUN: 14 mg/dL (ref 6–20)
CO2: 19 mmol/L — ABNORMAL LOW (ref 22–32)
Calcium: 9.1 mg/dL (ref 8.9–10.3)
Chloride: 105 mmol/L (ref 98–111)
Creatinine, Ser: 0.85 mg/dL (ref 0.44–1.00)
GFR, Estimated: >60 mL/min (ref >60)
Glucose, Bld: 95 mg/dL (ref 70–99)
Potassium: 3.4 mmol/L — ABNORMAL LOW (ref 3.5–5.1)
Sodium: 137 mmol/L (ref 135–145)
Total Bilirubin: 0.7 mg/dL (ref 0.3–1.2)
Total Protein: 6.9 g/dL (ref 6.5–8.1)

## 2023-05-22 LAB — CBC WITH DIFFERENTIAL/PLATELET
Abs Immature Granulocytes: 0.01 10*3/uL (ref 0.00–0.07)
Basophils Absolute: 0.1 10*3/uL (ref 0.0–0.1)
Basophils Relative: 1 %
Eosinophils Absolute: 0.2 10*3/uL (ref 0.0–0.5)
Eosinophils Relative: 2 %
HCT: 35.9 % — ABNORMAL LOW (ref 36.0–46.0)
Hemoglobin: 11.9 g/dL — ABNORMAL LOW (ref 12.0–15.0)
Immature Granulocytes: 0 %
Lymphocytes Relative: 39 %
Lymphs Abs: 3.3 10*3/uL (ref 0.7–4.0)
MCH: 30.1 pg (ref 26.0–34.0)
MCHC: 33.1 g/dL (ref 30.0–36.0)
MCV: 90.7 fL (ref 80.0–100.0)
Monocytes Absolute: 0.5 10*3/uL (ref 0.1–1.0)
Monocytes Relative: 5 %
Neutro Abs: 4.5 10*3/uL (ref 1.7–7.7)
Neutrophils Relative %: 53 %
Platelets: 202 10*3/uL (ref 150–400)
RBC: 3.96 MIL/uL (ref 3.87–5.11)
RDW: 12.7 % (ref 11.5–15.5)
WBC: 8.5 10*3/uL (ref 4.0–10.5)
nRBC: 0 % (ref 0.0–0.2)

## 2023-05-22 LAB — URINALYSIS, ROUTINE W REFLEX MICROSCOPIC
Bacteria, UA: NONE SEEN
Bilirubin Urine: NEGATIVE
Glucose, UA: NEGATIVE mg/dL
Ketones, ur: NEGATIVE mg/dL
Leukocytes,Ua: NEGATIVE
Nitrite: NEGATIVE
Protein, ur: NEGATIVE mg/dL
Specific Gravity, Urine: 1.014 (ref 1.005–1.030)
pH: 5 (ref 5.0–8.0)

## 2023-05-22 LAB — LIPASE, BLOOD: Lipase: 28 U/L (ref 11–51)

## 2023-05-22 MED ORDER — IOHEXOL 300 MG/ML  SOLN
100.0000 mL | Freq: Once | INTRAMUSCULAR | Status: AC | PRN
  Administered 2023-05-23: 100 mL via INTRAVENOUS

## 2023-05-22 MED ORDER — ONDANSETRON HCL 4 MG/2ML IJ SOLN
4.0000 mg | Freq: Once | INTRAMUSCULAR | Status: AC
  Administered 2023-05-22: 4 mg via INTRAVENOUS
  Filled 2023-05-22: qty 2

## 2023-05-22 MED ORDER — MORPHINE SULFATE (PF) 4 MG/ML IV SOLN
4.0000 mg | Freq: Once | INTRAVENOUS | Status: AC
  Administered 2023-05-22: 4 mg via INTRAVENOUS
  Filled 2023-05-22: qty 1

## 2023-05-22 NOTE — ED Triage Notes (Signed)
Pt c/o generalized severe abdominal pain since Saturday afternoon, noting lower abdominal/upper pelvic origin. Seen here yesterday for same. Currently rates pain 10/10 intermittently. Pt denies n/v/d.  No meds PTA

## 2023-05-22 NOTE — ED Provider Notes (Cosign Needed Addendum)
[place] EMERGENCY DEPARTMENT AT [facility] Provider Note   CSN: [id number] Arrival date & time: 05/22/23  2111     History  Chief Complaint  Patient presents with   Pelvic Pain    April Ford is a 57 y.o. female returning for reevaluation of lower abdominal pain which has become more severe since yesterday.  She was seen here at which time her lab tests were reassuring and her exam did not suggest an acute abdominal process, but returns today secondary to worsening pain which is now localizing to the suprapubic and right lower quadrant area.  She describes waxing and waning quality 10 out of 10 pain at its worst, worse with movement and best but not resolved in a fetal position.  She endorses abdominal distention and a squeezing sensation at the site.  She denies dysuria, vaginal discharge, fevers or chills, she does continue to have nausea with 1 episode of dry heaves yesterday but none today.  She has been afraid to eat as it makes her pain worse.  She denies diarrhea or constipation.  Her last bowel movement was this morning, normal and did not increase or worsen her pain.  She has had no documented fevers but has felt flushed when her pain is severe.  She has taken Tylenol with no improvement in her symptoms.  The history is provided by the patient.       Home Medications Prior to Admission medications   Medication Sig Start Date End Date Taking? Authorizing Provider  albuterol (VENTOLIN HFA) 108 (90 Base) MCG/ACT inhaler Inhale 2 puffs into the lungs every 6 (six) hours as needed for wheezing or shortness of breath. 02/13/23   Tommie Sams, DO  Budeson-Glycopyrrol-Formoterol (BREZTRI AEROSPHERE) 160-9-4.8 MCG/ACT AERO Inhale 2 puffs into the lungs 2 (two) times daily. 02/13/23   Tommie Sams, DO  cetirizine (ZYRTEC) 10 MG tablet Take 10 mg by mouth daily.    [provider]  ondansetron (ZOFRAN) 4 MG tablet Take 1 tablet (4 mg total) by mouth every 6 (six)  hours. 05/21/23   Netta Corrigan, PA-C      Allergies    Patient has no known allergies.    Review of Systems   Review of Systems  Constitutional:  Negative for chills and fever.  HENT: Negative.    Eyes: Negative.   Respiratory:  Negative for chest tightness and shortness of breath.   Cardiovascular:  Negative for chest pain.  Gastrointestinal:  Positive for abdominal pain, nausea and vomiting.  Genitourinary: Negative.  Negative for dysuria and vaginal discharge.  Musculoskeletal:  Negative for arthralgias, joint swelling and neck pain.  Skin: Negative.  Negative for rash and wound.  Neurological:  Negative for dizziness, weakness, light-headedness, numbness and headaches.  Psychiatric/Behavioral: Negative.    All other systems reviewed and are negative.   Physical Exam Updated Vital Signs BP 136/72 (BP Location: Left Arm)   Pulse (!) 57   Temp 98.7 F (37.1 C) (Oral)   Resp 18   Ht 5\' 7"  (1.702 m)   Wt 83.9 kg   SpO2 100%   BMI 28.98 kg/m  Physical Exam Vitals and nursing note reviewed.  Constitutional:      Appearance: She is well-developed.  HENT:     Head: Normocephalic and atraumatic.  Eyes:     Conjunctiva/sclera: Conjunctivae normal.  Cardiovascular:     Rate and Rhythm: Normal rate and regular rhythm.     Heart sounds: Normal heart sounds.  Pulmonary:     Effort: Pulmonary effort is normal.     Breath sounds: Normal breath sounds. No wheezing.  Abdominal:     General: Bowel sounds are normal. There is distension.     Palpations: Abdomen is soft.     Tenderness: There is abdominal tenderness in the right lower quadrant and suprapubic area. There is no guarding.     Comments: Increased tympany upper abdomen.  Musculoskeletal:        General: Normal range of motion.     Cervical back: Normal range of motion.  Skin:    General: Skin is warm and dry.  Neurological:     Mental Status: She is alert.     ED Results / Procedures / Treatments    Labs (all labs ordered are listed, but only abnormal results are displayed) Labs Reviewed  CBC WITH DIFFERENTIAL/PLATELET - Abnormal; Notable for the following components:      Result Value   Hemoglobin 11.9 (*)    HCT 35.9 (*)    All other components within normal limits  COMPREHENSIVE METABOLIC PANEL  LIPASE, BLOOD  URINALYSIS, ROUTINE W REFLEX MICROSCOPIC    EKG None  Radiology No results found.  Procedures Procedures    Medications Ordered in ED Medications  iohexol (OMNIPAQUE) 300 MG/ML solution 100 mL (has no administration in time range)  morphine (PF) 4 MG/ML injection 4 mg (4 mg Intravenous Given 05/22/23 2330)  ondansetron (ZOFRAN) injection 4 mg (4 mg Intravenous Given 05/22/23 2328)    ED Course/ Medical Decision Making/ A&P                             Medical Decision Making Pt presenting with worsening abdominal pain since seen here yesterday, now more localized to the suprapubic and rlq area, no fever, no dysuria, vaginal dc, no flank pain.  Nausea with emesis x 1 present.  Concerning acute appendicitis vs divericulitis, bowel obstruction, colitis other possible ddx. Ct imaging and repeat labs pending.  Pt given morphine and zofran IV.   Discussed with Dr Bernette Mayers who assumes pt care.  Amount and/or Complexity of Data Reviewed Labs: ordered. Radiology: ordered.  Risk Prescription drug management.           Final Clinical Impression(s) / ED Diagnoses Final diagnoses:  None    Rx / DC Orders ED Discharge Orders     None         Victoriano Lain 05/22/23 2335    Burgess Amor, PA-C 05/22/23 2335    Pollyann Savoy, MD 05/23/23 [phone]

## 2023-05-23 MED ORDER — DICYCLOMINE HCL 20 MG PO TABS: 20.0000 mg | ORAL_TABLET | Freq: Two times a day (BID) | ORAL | 0 refills | Status: DC

## 2023-05-23 NOTE — ED Provider Notes (Signed)
Care of the patient assumed at shift change. Here for abdominal pain and vomiting. Seen for same yesterday with neg labs. Worse today. Pending lab recheck and CT.  Physical Exam  BP (!) 141/76 (BP Location: Left Arm)   Pulse (!) 57   Temp 98.7 F (37.1 C) (Oral)   Resp 17   Ht 5\' 7"  (1.702 m)   Wt 83.9 kg   SpO2 94%   BMI 28.98 kg/m   Physical Exam  Procedures  Procedures  ED Course / MDM   Clinical Course as of 05/23/23 0056  Tue May 23, 2023  0002 CBC, CMP, lipase and UA are neg.  [CS]  1610 I personally viewed the images from radiology studies and agree with radiologist interpretation: CT is neg for acute process. She reports she is feeling better. Plan discharge, already has Rx for Zofran from prior ED visit. Will add Bentyl. Recommend PCP follow up, RTED for any other concerns. Advised to stop smoking THC.   [CS]    Clinical Course User Index [CS] Pollyann Savoy, MD   Medical Decision Making Problems Addressed: Generalized abdominal pain: acute illness or injury  Amount and/or Complexity of Data Reviewed Labs: ordered. Decision-making details documented in ED Course. Radiology: ordered and independent interpretation performed. Decision-making details documented in ED Course.  Risk Prescription drug management.          Pollyann Savoy, MD 05/23/23 [phone]

## 2023-05-23 NOTE — ED Notes (Signed)
Patient transported to CT 

## 2023-05-24 ENCOUNTER — Telehealth: Payer: Self-pay

## 2023-05-24 NOTE — Transitions of Care (Post Inpatient/ED Visit) (Signed)
   05/24/2023  Name: April Ford MRN: [id number] DOB: May 03, 1966  Today's TOC FU Call Status: Today's TOC FU Call Status:: Successful TOC FU Call Competed TOC FU Call Complete Date: 05/24/23  Transition Care Management Follow-up Telephone Call Date of Discharge: 05/23/23 [facility] Type of Discharge: Emergency Department Reason for ED Visit: Other: (abd pain) How have you been since you were released from the hospital?: Same Any questions or concerns?: No  Items Reviewed: Did you receive and understand the discharge instructions provided?: Yes Medications obtained,verified, and reconciled?: Yes (Medications Reviewed) Any new allergies since your discharge?: No Dietary orders reviewed?: Yes Do you have support at home?: No  Medications Reviewed Today: Medications Reviewed Today     Reviewed by Karena Addison, LPN (Licensed Practical Nurse) on 05/24/23 at 1129  Med List Status: <None>   Medication Order Taking? Sig Documenting Provider Last Dose Status Informant  albuterol (VENTOLIN HFA) 108 (90 Base) MCG/ACT inhaler [id number] Yes Inhale 2 puffs into the lungs every 6 (six) hours as needed for wheezing or shortness of breath. Tommie Sams, DO Taking Active   Budeson-Glycopyrrol-Formoterol (BREZTRI AEROSPHERE) 160-9-4.8 MCG/ACT Sandrea Matte [id number] Yes Inhale 2 puffs into the lungs 2 (two) times daily. Tommie Sams, DO Taking Active   cetirizine (ZYRTEC) 10 MG tablet [id number] Yes Take 10 mg by mouth daily. [provider] Taking Active   dicyclomine (BENTYL) 20 MG tablet [id number] Yes Take 1 tablet (20 mg total) by mouth 2 (two) times daily. Pollyann Savoy, MD Taking Active   ondansetron [facility]) 4 MG tablet [id number] Yes Take 1 tablet (4 mg total) by mouth every 6 (six) hours. Netta Corrigan, PA-C Taking Active             Home Care and Equipment/Supplies: Were Home Health Services Ordered?: NA Any new equipment or medical supplies  ordered?: NA  Functional Questionnaire: Do you need assistance with bathing/showering or dressing?: No Do you need assistance with meal preparation?: No Do you need assistance with eating?: No Do you have difficulty maintaining continence: No Do you need assistance with getting out of bed/getting out of a chair/moving?: No Do you have difficulty managing or taking your medications?: No  Follow up appointments reviewed: PCP Follow-up appointment confirmed?: Yes Date of PCP follow-up appointment?: 05/29/23 Follow-up Provider: [facility] Follow-up appointment confirmed?: NA Do you need transportation to your follow-up appointment?: No Do you understand care options if your condition(s) worsen?: Yes-patient verbalized understanding    SIGNATURE Karena Addison, LPN [facility] Nurse Health Advisor Direct Dial [phone]

## 2023-05-29 ENCOUNTER — Ambulatory Visit: Payer: Self-pay | Admitting: Family Medicine

## 2023-06-16 ENCOUNTER — Ambulatory Visit (INDEPENDENT_AMBULATORY_CARE_PROVIDER_SITE_OTHER): Payer: Self-pay | Admitting: Family Medicine

## 2023-06-16 ENCOUNTER — Encounter: Payer: Self-pay | Admitting: Family Medicine

## 2023-06-16 VITALS — BP 113/72 | HR 54 | Temp 97.2°F | Ht 67.0 in | Wt 195.6 lb

## 2023-06-16 DIAGNOSIS — K589 Irritable bowel syndrome without diarrhea: Secondary | ICD-10-CM | POA: Insufficient documentation

## 2023-06-16 MED ORDER — HYOSCYAMINE SULFATE 0.125 MG PO TBDP: 0.1250 mg | ORAL_TABLET | Freq: Four times a day (QID) | ORAL | 0 refills | Status: DC | PRN

## 2023-06-16 NOTE — Patient Instructions (Signed)
Medication as directed.  Low FODMAP diet.  Call with concerns.  Consider seeing GI if symptoms continue to be troublesome despite medication.

## 2023-06-16 NOTE — Progress Notes (Signed)
Subjective:  Patient ID: April Ford, female    DOB: [birth year]  Age: 57 y.o. MRN: [id number]  CC: Chief Complaint  Patient presents with   Hospitalization Follow-up   Abdominal Pain    HPI:  57 year old female presents for evaluation of the above.  Patient has had recent lower abdominal pain.  Has been seen in the ER twice.  CT scan negative.  Labs negative.  Patient states that she has had issues with her abdomen for years.  Endorses lower abdominal pain as well as intermittent diarrhea and constipation.  No fever.  No current pain.  No other complaints or concerns at this time.  Patient Active Problem List   Diagnosis Date Noted   IBS (irritable bowel syndrome) 06/16/2023   Former smoker 02/13/2023   COPD (chronic obstructive pulmonary disease) (HCC) 09/06/2022   Mixed hyperlipidemia 09/06/2022   Preventative health care 09/06/2022    Social Hx   Social History   Socioeconomic History   Marital status: Single    Spouse name: Not on file   Number of children: Not on file   Years of education: Not on file   Highest education level: Not on file  Occupational History   Not on file  Tobacco Use   Smoking status: Former    Years: 40    Types: Cigarettes    Quit date: 06/23/2021    Years since quitting: 1.9   Smokeless tobacco: Never  Vaping Use   Vaping Use: Every day   Start date: 02/23/2022   Substances: Flavoring  Substance and Sexual Activity   Alcohol use: Not Currently   Drug use: Not Currently   Sexual activity: Not Currently    Birth control/protection: None  Other Topics Concern   Not on file  Social History Narrative   Not on file   Social Determinants of Health   Financial Resource Strain: Not on file  Food Insecurity: Not on file  Transportation Needs: Not on file  Physical Activity: Not on file  Stress: Not on file  Social Connections: Not on file    Review of Systems Per HPI  Objective:  BP 113/72   Pulse (!) 54   Temp (!) 97.2 F  (36.2 C)   Ht 5\' 7"  (1.702 m)   Wt 195 lb 9.6 oz (88.7 kg)   SpO2 100%   BMI 30.64 kg/m      06/16/2023    8:37 AM 05/23/2023   12:00 AM 05/22/2023   11:30 PM  BP/Weight  Systolic BP 113 141 136  Diastolic BP 72 76 72  Wt. (Lbs) 195.6    BMI 30.64 kg/m2      Physical Exam Vitals and nursing note reviewed.  Constitutional:      General: She is not in acute distress.    Appearance: Normal appearance.  HENT:     Head: Normocephalic and atraumatic.  Cardiovascular:     Rate and Rhythm: Normal rate and regular rhythm.  Pulmonary:     Effort: Pulmonary effort is normal.     Breath sounds: Normal breath sounds. No wheezing, rhonchi or rales.  Abdominal:     General: There is no distension.     Palpations: Abdomen is soft.     Comments: Patient endorsing tenderness in the lower abdomen diffusely.  Neurological:     Mental Status: She is alert.  Psychiatric:        Mood and Affect: Mood normal.        Behavior: Behavior  normal.     Lab Results  Component Value Date   WBC 8.5 05/22/2023   HGB 11.9 (L) 05/22/2023   HCT 35.9 (L) 05/22/2023   PLT 202 05/22/2023   GLUCOSE 95 05/22/2023   CHOL 184 09/05/2022   TRIG 192 (H) 09/05/2022   HDL 48 09/05/2022   LDLCALC 103 (H) 09/05/2022   ALT 11 05/22/2023   AST 17 05/22/2023   NA 137 05/22/2023   K 3.4 (L) 05/22/2023   CL 105 05/22/2023   CREATININE 0.85 05/22/2023   BUN 14 05/22/2023   CO2 19 (L) 05/22/2023     Assessment & Plan:   Problem List Items Addressed This Visit       Digestive   IBS (irritable bowel syndrome) - Primary    Patient's abdominal pain likely secondary to IBS.  Trial of hyoscyamine. Advised low FODMAP diet.      Relevant Medications   hyoscyamine (ANASPAZ) 0.125 MG TBDP disintergrating tablet    Meds ordered this encounter  Medications   hyoscyamine (ANASPAZ) 0.125 MG TBDP disintergrating tablet    Sig: Place 1 tablet (0.125 mg total) under the tongue every 6 (six) hours as needed  (Abdominal pain).    Dispense:  120 tablet    Refill:  0    Gresia Isidoro DO [facility]

## 2023-06-16 NOTE — Assessment & Plan Note (Addendum)
Patient's abdominal pain likely secondary to IBS.  Trial of hyoscyamine. Advised low FODMAP diet.

## 2023-08-14 ENCOUNTER — Ambulatory Visit: Payer: BC Managed Care – PPO | Admitting: Family Medicine

## 2023-08-23 ENCOUNTER — Encounter (HOSPITAL_COMMUNITY): Payer: Self-pay | Admitting: Emergency Medicine

## 2023-08-23 ENCOUNTER — Emergency Department (HOSPITAL_COMMUNITY): Payer: 59

## 2023-08-23 ENCOUNTER — Emergency Department (HOSPITAL_COMMUNITY)
Admission: EM | Admit: 2023-08-23 | Discharge: 2023-08-24 | Disposition: A | Discharge status: Home / Self Care | Payer: 59 | Attending: Emergency Medicine | Admitting: Emergency Medicine

## 2023-08-23 ENCOUNTER — Other Ambulatory Visit: Payer: Self-pay

## 2023-08-23 DIAGNOSIS — R519 Headache, unspecified: Secondary | ICD-10-CM | POA: Diagnosis not present

## 2023-08-23 DIAGNOSIS — Z1152 Encounter for screening for COVID-19: Secondary | ICD-10-CM | POA: Diagnosis not present

## 2023-08-23 DIAGNOSIS — R1110 Vomiting, unspecified: Secondary | ICD-10-CM | POA: Insufficient documentation

## 2023-08-23 DIAGNOSIS — H53149 Visual discomfort, unspecified: Secondary | ICD-10-CM | POA: Insufficient documentation

## 2023-08-23 LAB — CBC
HCT: 39.8 % (ref 36.0–46.0)
Hemoglobin: 13.2 g/dL (ref 12.0–15.0)
MCH: 30 pg (ref 26.0–34.0)
MCHC: 33.2 g/dL (ref 30.0–36.0)
MCV: 90.5 fL (ref 80.0–100.0)
Platelets: 258 10*3/uL (ref 150–400)
RBC: 4.4 MIL/uL (ref 3.87–5.11)
RDW: 13.1 % (ref 11.5–15.5)
WBC: 11.3 10*3/uL — ABNORMAL HIGH (ref 4.0–10.5)
nRBC: 0 % (ref 0.0–0.2)

## 2023-08-23 LAB — SARS CORONAVIRUS 2 BY RT PCR: SARS Coronavirus 2 by RT PCR: NEGATIVE

## 2023-08-23 LAB — URINALYSIS, ROUTINE W REFLEX MICROSCOPIC
Bilirubin Urine: NEGATIVE
Glucose, UA: NEGATIVE mg/dL
Ketones, ur: 20 mg/dL — AB
Leukocytes,Ua: NEGATIVE
Nitrite: NEGATIVE
Protein, ur: NEGATIVE mg/dL
Specific Gravity, Urine: 1.016 (ref 1.005–1.030)
pH: 5 (ref 5.0–8.0)

## 2023-08-23 LAB — COMPREHENSIVE METABOLIC PANEL
ALT: 12 U/L (ref 0–44)
AST: 18 U/L (ref 15–41)
Albumin: 4.7 g/dL (ref 3.5–5.0)
Alkaline Phosphatase: 72 U/L (ref 38–126)
Anion gap: 10 (ref 5–15)
BUN: 13 mg/dL (ref 6–20)
CO2: 24 mmol/L (ref 22–32)
Calcium: 9.4 mg/dL (ref 8.9–10.3)
Chloride: 103 mmol/L (ref 98–111)
Creatinine, Ser: 0.82 mg/dL (ref 0.44–1.00)
GFR, Estimated: >60 mL/min (ref >60)
Glucose, Bld: 101 mg/dL — ABNORMAL HIGH (ref 70–99)
Potassium: 3.8 mmol/L (ref 3.5–5.1)
Sodium: 137 mmol/L (ref 135–145)
Total Bilirubin: 1.2 mg/dL (ref 0.3–1.2)
Total Protein: 8.3 g/dL — ABNORMAL HIGH (ref 6.5–8.1)

## 2023-08-23 LAB — LIPASE, BLOOD: Lipase: 19 U/L (ref 11–51)

## 2023-08-23 MED ORDER — KETOROLAC TROMETHAMINE 15 MG/ML IJ SOLN
15.0000 mg | Freq: Once | INTRAMUSCULAR | Status: AC
  Administered 2023-08-23: 15 mg via INTRAVENOUS
  Filled 2023-08-23: qty 1

## 2023-08-23 MED ORDER — SODIUM CHLORIDE 0.9 % IV BOLUS
500.0000 mL | Freq: Once | INTRAVENOUS | Status: AC
  Administered 2023-08-23: 500 mL via INTRAVENOUS

## 2023-08-23 MED ORDER — PROCHLORPERAZINE EDISYLATE 10 MG/2ML IJ SOLN
5.0000 mg | Freq: Once | INTRAMUSCULAR | Status: AC
  Administered 2023-08-23: 5 mg via INTRAVENOUS
  Filled 2023-08-23: qty 2

## 2023-08-23 NOTE — ED Provider Notes (Signed)
[place] EMERGENCY DEPARTMENT AT [facility] Provider Note   CSN: [id number] Arrival date & time: 08/23/23  1832     History {Add pertinent medical, surgical, social history, OB history to HPI:1} Chief Complaint  Patient presents with   Headache   Emesis    April Ford is a 57 y.o. female.   Headache Associated symptoms: vomiting   Emesis Associated symptoms: headaches   Patient presents with headache for the last 5 or 6 days.  Has had vomiting.  No fever.  Headache is on the left side of her head.  Began gradually.  Has had some mild relief at times with at home medicines.  He does not tend to get headaches.  No other numbness or weakness.  No confusion.  No trauma.     Home Medications Prior to Admission medications   Medication Sig Start Date End Date Taking? Authorizing Provider  cetirizine (ZYRTEC) 10 MG tablet Take 10 mg by mouth daily.    [provider]  hyoscyamine (ANASPAZ) 0.125 MG TBDP disintergrating tablet Place 1 tablet (0.125 mg total) under the tongue every 6 (six) hours as needed (Abdominal pain). 06/16/23   Tommie Sams, DO  ondansetron (ZOFRAN) 4 MG tablet Take 1 tablet (4 mg total) by mouth every 6 (six) hours. 05/21/23   Netta Corrigan, PA-C      Allergies    Patient has no known allergies.    Review of Systems   Review of Systems  Gastrointestinal:  Positive for vomiting.  Neurological:  Positive for headaches.    Physical Exam Updated Vital Signs BP 118/64   Pulse (!) 48   Temp 99.1 F (37.3 C)   Resp 16   Ht 5\' 7"  (1.702 m)   Wt 83.9 kg   SpO2 100%   BMI 28.98 kg/m  Physical Exam Vitals reviewed.  HENT:     Head: Atraumatic.     Comments: No tenderness to left temple. Eyes:     Pupils: Pupils are equal, round, and reactive to light.  Cardiovascular:     Rate and Rhythm: Normal rate.  Musculoskeletal:     Cervical back: Neck supple.  Neurological:     Mental Status: She is alert.     ED Results /  Procedures / Treatments   Labs (all labs ordered are listed, but only abnormal results are displayed) Labs Reviewed  COMPREHENSIVE METABOLIC PANEL - Abnormal; Notable for the following components:      Result Value   Glucose, Bld 101 (*)    Total Protein 8.3 (*)    All other components within normal limits  CBC - Abnormal; Notable for the following components:   WBC 11.3 (*)    All other components within normal limits  URINALYSIS, ROUTINE W REFLEX MICROSCOPIC - Abnormal; Notable for the following components:   Hgb urine dipstick MODERATE (*)    Ketones, ur 20 (*)    Bacteria, UA RARE (*)    All other components within normal limits  SARS CORONAVIRUS 2 BY RT PCR  LIPASE, BLOOD  SEDIMENTATION RATE    EKG None  Radiology No results found.  Procedures Procedures  {Document cardiac monitor, telemetry assessment procedure when appropriate:1}  Medications Ordered in ED Medications  ketorolac (TORADOL) 15 MG/ML injection 15 mg (15 mg Intravenous Given 08/23/23 2259)  prochlorperazine (COMPAZINE) injection 5 mg (5 mg Intravenous Given 08/23/23 2300)  sodium chloride 0.9 % bolus 500 mL (500 mLs Intravenous New Bag/Given 08/23/23 2258)  ED Course/ Medical Decision Making/ A&P   {   Click here for ABCD2, HEART and other calculatorsREFRESH Note before signing :1}                              Medical Decision Making Amount and/or Complexity of Data Reviewed Labs: ordered. Radiology: ordered.  Risk Prescription drug management.   Patient with new onset headache around 5 days ago.  Left side of head.  No vision changes but does have mild photophobia.  Good eye movements.  No numbness or weakness.  Differential diagnose includes nonspecific headache, intracranial hemorrhage, migraine, temporal arteritis.  Will get CT to evaluate.  Will treat with fluids Toradol and Compazine.   Feeling better after treatment.  Head CT independently interpreted and reassuring.  However pending  result of sed rate.  Discussed with patient and family member.  PCP can follow-up with results of sed rate.  {Document critical care time when appropriate:1} {Document review of labs and clinical decision tools ie heart score, Chads2Vasc2 etc:1}  {Document your independent review of radiology images, and any outside records:1} {Document your discussion with family members, caretakers, and with consultants:1} {Document social determinants of health affecting pt's care:1} {Document your decision making why or why not admission, treatments were needed:1} Final Clinical Impression(s) / ED Diagnoses Final diagnoses:  Bad headache    Rx / DC Orders ED Discharge Orders     None

## 2023-08-23 NOTE — ED Triage Notes (Signed)
Pt complains of headache x 5 days, vomited multiple times today. Denies fever, denies nausea. Pt states head pain is primarily on left side.

## 2023-08-23 NOTE — Discharge Instructions (Signed)
Dr. Adriana Simas can follow-up the result of the sed rate.

## 2023-08-23 NOTE — ED Notes (Signed)
Pt ambulatory to room from triage.  

## 2023-08-24 LAB — SEDIMENTATION RATE: Sed Rate: 20 mm/h (ref 0–22)

## 2023-08-24 NOTE — ED Notes (Signed)
Discharge instructions provided by edp were discussed with pt. Pt verbalized understanding with no additional questions at this time. Pt going home with daughter at bedside

## 2023-09-18 ENCOUNTER — Ambulatory Visit: Payer: 59 | Admitting: Family Medicine

## 2023-09-18 VITALS — BP 98/61 | HR 60 | Temp 98.7°F | Wt 188.2 lb

## 2023-09-18 DIAGNOSIS — Z1211 Encounter for screening for malignant neoplasm of colon: Secondary | ICD-10-CM | POA: Diagnosis not present

## 2023-09-18 DIAGNOSIS — K589 Irritable bowel syndrome without diarrhea: Secondary | ICD-10-CM | POA: Diagnosis not present

## 2023-09-18 DIAGNOSIS — R630 Anorexia: Secondary | ICD-10-CM | POA: Diagnosis not present

## 2023-09-18 MED ORDER — QUETIAPINE FUMARATE 25 MG PO TABS: 25.0000 mg | ORAL_TABLET | Freq: Every day | ORAL | 1 refills | Status: DC

## 2023-09-18 NOTE — Patient Instructions (Signed)
Medication as prescribed.  I am placing a referral to GI.  Follow up in 6 months.   Take care  Dr. Adriana Simas

## 2023-09-18 NOTE — Progress Notes (Signed)
Subjective:  Patient ID: April Ford, female    DOB: Sep 21, 1966  Age: 57 y.o. MRN: [id number]  CC: Follow up   HPI:  57 year old female presents for follow-up.  Patient states overall she is doing okay.  She still struggles with GI symptoms.  She is in need of colonoscopy.  She is amenable to referral.  Patient reports decreased appetite.  No significant weight loss.  She states that she was previously on Seroquel and this seemed to help her appetite as well as her mood.  She has a history of prior substance abuse.   Patient Active Problem List   Diagnosis Date Noted   Decreased appetite 09/18/2023   IBS (irritable bowel syndrome) 06/16/2023   Former smoker 02/13/2023   COPD (chronic obstructive pulmonary disease) (HCC) 09/06/2022   Mixed hyperlipidemia 09/06/2022   Preventative health care 09/06/2022    Social Hx   Social History   Socioeconomic History   Marital status: Single    Spouse name: Not on file   Number of children: Not on file   Years of education: Not on file   Highest education level: Not on file  Occupational History   Not on file  Tobacco Use   Smoking status: Former    Current packs/day: 0.00    Types: Cigarettes    Start date: 06/23/1980    Quit date: 06/23/2021    Years since quitting: 2.2   Smokeless tobacco: Never  Vaping Use   Vaping status: Every Day   Start date: 02/23/2022   Substances: Flavoring  Substance and Sexual Activity   Alcohol use: Not Currently   Drug use: Not Currently   Sexual activity: Not Currently    Birth control/protection: None  Other Topics Concern   Not on file  Social History Narrative   Not on file   Social Determinants of Health   Financial Resource Strain: Not on file  Food Insecurity: Not on file  Transportation Needs: Not on file  Physical Activity: Not on file  Stress: Not on file  Social Connections: Not on file    Review of Systems Per HPI  Objective:  BP 98/61   Pulse 60   Temp 98.7 F  (37.1 C) (Oral)   Wt 188 lb 3.2 oz (85.4 kg)   SpO2 99%   BMI 29.48 kg/m      09/18/2023    3:43 PM 08/24/2023   12:33 AM 08/23/2023   11:15 PM  BP/Weight  Systolic BP 98 135 118  Diastolic BP 61 81 64  Wt. (Lbs) 188.2    BMI 29.48 kg/m2      Physical Exam Constitutional:      General: She is not in acute distress.    Appearance: Normal appearance.  HENT:     Head: Normocephalic and atraumatic.  Cardiovascular:     Rate and Rhythm: Normal rate and regular rhythm.  Pulmonary:     Effort: Pulmonary effort is normal.     Breath sounds: Normal breath sounds.  Neurological:     Mental Status: She is alert.  Psychiatric:        Mood and Affect: Mood normal.        Behavior: Behavior normal.     Lab Results  Component Value Date   WBC 11.3 (H) 08/23/2023   HGB 13.2 08/23/2023   HCT 39.8 08/23/2023   PLT 258 08/23/2023   GLUCOSE 101 (H) 08/23/2023   CHOL 184 09/05/2022   TRIG 192 (H) 09/05/2022  HDL 48 09/05/2022   LDLCALC 103 (H) 09/05/2022   ALT 12 08/23/2023   AST 18 08/23/2023   NA 137 08/23/2023   K 3.8 08/23/2023   CL 103 08/23/2023   CREATININE 0.82 08/23/2023   BUN 13 08/23/2023   CO2 24 08/23/2023     Assessment & Plan:   Problem List Items Addressed This Visit       Digestive   IBS (irritable bowel syndrome)   Relevant Orders   Ambulatory referral to Gastroenterology     Other   Decreased appetite - Primary    Given her prior history, placing back on Seroquel.  Low-dose.      Other Visit Diagnoses     Encounter for screening colonoscopy       Relevant Orders   Ambulatory referral to Gastroenterology       Meds ordered this encounter  Medications   QUEtiapine (SEROQUEL) 25 MG tablet    Sig: Take 1 tablet (25 mg total) by mouth at bedtime.    Dispense:  90 tablet    Refill:  1    Follow-up:  Return in about 6 months (around 03/17/2024).  Everlene Other DO [facility]

## 2023-09-18 NOTE — Assessment & Plan Note (Signed)
Given her prior history, placing back on Seroquel.  Low-dose.

## 2023-09-26 ENCOUNTER — Ambulatory Visit: Payer: 59 | Admitting: Gastroenterology

## 2023-09-28 ENCOUNTER — Encounter: Payer: Self-pay | Admitting: Internal Medicine

## 2023-09-28 ENCOUNTER — Ambulatory Visit (INDEPENDENT_AMBULATORY_CARE_PROVIDER_SITE_OTHER): Payer: Self-pay | Admitting: Internal Medicine

## 2023-09-28 VITALS — BP 116/69 | HR 67 | Temp 98.6°F | Ht 67.0 in | Wt 185.8 lb

## 2023-09-28 DIAGNOSIS — R110 Nausea: Secondary | ICD-10-CM | POA: Diagnosis not present

## 2023-09-28 DIAGNOSIS — G8929 Other chronic pain: Secondary | ICD-10-CM

## 2023-09-28 DIAGNOSIS — R1013 Epigastric pain: Secondary | ICD-10-CM

## 2023-09-28 DIAGNOSIS — Z1211 Encounter for screening for malignant neoplasm of colon: Secondary | ICD-10-CM

## 2023-09-28 NOTE — Progress Notes (Signed)
Primary Care Physician:  Tommie Sams, DO Primary Gastroenterologist:  Dr. Marletta Lor  Chief Complaint  Patient presents with   New Patient (Initial Visit)    Pt referred for colonoscopy and IBS    HPI:   April Ford is a 57 y.o. female who presents to clinic today to discuss screening colonoscopy.  No prior colonoscopy.  No melena hematochezia.  No unintentional weight loss.  Denies any family history of colorectal malignancy.  States her bowels are moving well, no issues with constipation or diarrhea.  Does note that she has had "stomach issues" her whole life, notes abdominal pain/pressure primarily epigastric, mild, intermittent.  States for a week she has not had any symptoms.  Believes this is diet related as she drinks too many sodas.  No acid reflux or heartburn.  No dysphagia or odynophagia  Past Medical History:  Diagnosis Date   Bronchiolitis    COPD (chronic obstructive pulmonary disease) (HCC)     Past Surgical History:  Procedure Laterality Date   ABDOMINAL HYSTERECTOMY      Current Outpatient Medications  Medication Sig Dispense Refill   QUEtiapine (SEROQUEL) 25 MG tablet Take 1 tablet (25 mg total) by mouth at bedtime. 90 tablet 1   cetirizine (ZYRTEC) 10 MG tablet Take 10 mg by mouth daily. (Patient not taking: Reported on 09/28/2023)     hyoscyamine (ANASPAZ) 0.125 MG TBDP disintergrating tablet Place 1 tablet (0.125 mg total) under the tongue every 6 (six) hours as needed (Abdominal pain). (Patient not taking: Reported on 09/28/2023) 120 tablet 0   ondansetron (ZOFRAN) 4 MG tablet Take 1 tablet (4 mg total) by mouth every 6 (six) hours. (Patient not taking: Reported on 09/28/2023) 12 tablet 0   No current facility-administered medications for this visit.    Allergies as of 09/28/2023   (No Known Allergies)    No family history on file.  Social History   Socioeconomic History   Marital status: Single    Spouse name: Not on file   Number of  children: Not on file   Years of education: Not on file   Highest education level: Not on file  Occupational History   Not on file  Tobacco Use   Smoking status: Former    Current packs/day: 0.00    Types: Cigarettes    Start date: 06/23/1980    Quit date: 06/23/2021    Years since quitting: 2.2   Smokeless tobacco: Never  Vaping Use   Vaping status: Every Day   Start date: 02/23/2022   Substances: Flavoring  Substance and Sexual Activity   Alcohol use: Not Currently   Drug use: Not Currently   Sexual activity: Not Currently    Birth control/protection: None  Other Topics Concern   Not on file  Social History Narrative   Not on file   Social Determinants of Health   Financial Resource Strain: Not on file  Food Insecurity: Not on file  Transportation Needs: Not on file  Physical Activity: Not on file  Stress: Not on file  Social Connections: Not on file  Intimate Partner Violence: Not on file    Subjective: Review of Systems  Constitutional:  Negative for chills and fever.  HENT:  Negative for congestion and hearing loss.   Eyes:  Negative for blurred vision and double vision.  Respiratory:  Negative for cough and shortness of breath.   Cardiovascular:  Negative for chest pain and palpitations.  Gastrointestinal:  Positive for nausea. Negative for  abdominal pain, blood in stool, constipation, diarrhea, heartburn, melena and vomiting.  Genitourinary:  Negative for dysuria and urgency.  Musculoskeletal:  Negative for joint pain and myalgias.  Skin:  Negative for itching and rash.  Neurological:  Negative for dizziness and headaches.  Psychiatric/Behavioral:  Negative for depression. The patient is not nervous/anxious.        Objective: BP 116/69   Pulse 67   Temp 98.6 F (37 C)   Ht 5\' 7"  (1.702 m)   Wt 185 lb 12.8 oz (84.3 kg)   BMI 29.10 kg/m  Physical Exam Constitutional:      Appearance: Normal appearance.  HENT:     Head: Normocephalic and atraumatic.   Eyes:     Extraocular Movements: Extraocular movements intact.     Conjunctiva/sclera: Conjunctivae normal.  Cardiovascular:     Rate and Rhythm: Normal rate and regular rhythm.  Pulmonary:     Effort: Pulmonary effort is normal.     Breath sounds: Normal breath sounds.  Abdominal:     General: Bowel sounds are normal.     Palpations: Abdomen is soft.  Musculoskeletal:        General: No swelling. Normal range of motion.     Cervical back: Normal range of motion and neck supple.  Skin:    General: Skin is warm and dry.     Coloration: Skin is not jaundiced.  Neurological:     General: No focal deficit present.     Mental Status: She is alert and oriented to person, place, and time.  Psychiatric:        Mood and Affect: Mood normal.        Behavior: Behavior normal.      Assessment: *Colon cancer screening *Epigastric pain *Nausea  Plan: Will schedule for screening colonoscopy.The risks including infection, bleed, or perforation as well as benefits, limitations, alternatives and imponderables have been reviewed with the patient. Questions have been answered. All parties agreeable.  Regards to her epigastric pain and intermittent nausea, etiology unclear.  Reports her symptoms are very mild, thinks they are diet related.  She states for the last week she has been doing well.  Does note drinking sodas daily, recommend she decrease this.  Previously trialed on hyoscyamine which she is no longer taking.  Consider further workup on follow-up visit pending clinical course  09/28/2023 2:57 PM   Disclaimer: This note was dictated with voice recognition software. Similar sounding words can inadvertently be transcribed and may not be corrected upon review.

## 2023-09-28 NOTE — Patient Instructions (Signed)
We will schedule you for colonoscopy for colon cancer screening purposes.  It was very nice meeting you today.  Dr. Marletta Lor

## 2023-09-29 ENCOUNTER — Encounter: Payer: Self-pay | Admitting: *Deleted

## 2023-10-04 ENCOUNTER — Encounter: Payer: Self-pay | Admitting: *Deleted

## 2023-10-27 ENCOUNTER — Encounter: Payer: Self-pay | Admitting: Emergency Medicine

## 2023-10-27 ENCOUNTER — Ambulatory Visit
Admission: EM | Admit: 2023-10-27 | Discharge: 2023-10-27 | Disposition: A | Discharge status: Home / Self Care | Payer: Self-pay | Attending: Family Medicine | Admitting: Family Medicine

## 2023-10-27 DIAGNOSIS — R21 Rash and other nonspecific skin eruption: Secondary | ICD-10-CM

## 2023-10-27 MED ORDER — TRIAMCINOLONE ACETONIDE 0.1 % EX CREA: 1.0000 | TOPICAL_CREAM | Freq: Two times a day (BID) | CUTANEOUS | 0 refills | Status: DC

## 2023-10-27 MED ORDER — PREDNISONE 10 MG PO TABS: ORAL_TABLET | ORAL | 0 refills | Status: DC

## 2023-10-27 NOTE — ED Provider Notes (Signed)
[facility]    CSN: [id number] Arrival date & time: 10/27/23  0930      History   Chief Complaint No chief complaint on file.   HPI April Ford is a 57 y.o. female.   Patient presenting today with new onset itchy rash x 1 day.  States it is over most of body.  Denies throat itching or swelling, chest tightness, shortness of breath, fever, chills, nausea, vomiting, new exposures or medications or dietary changes.  So far not trying anything over-the-counter for symptoms.    Past Medical History:  Diagnosis Date   Bronchiolitis    COPD (chronic obstructive pulmonary disease) (HCC)     Patient Active Problem List   Diagnosis Date Noted   Decreased appetite 09/18/2023   IBS (irritable bowel syndrome) 06/16/2023   Former smoker 02/13/2023   COPD (chronic obstructive pulmonary disease) (HCC) 09/06/2022   Mixed hyperlipidemia 09/06/2022   Preventative health care 09/06/2022    Past Surgical History:  Procedure Laterality Date   ABDOMINAL HYSTERECTOMY      OB History   No obstetric history on file.      Home Medications    Prior to Admission medications   Medication Sig Start Date End Date Taking? Authorizing Provider  predniSONE (DELTASONE) 10 MG tablet Take 6 tabs daily x 2 days, 5 tabs daily x 2 days, 4 tabs daily x 2 days, etc 10/27/23  Yes Particia Nearing, PA-C  triamcinolone cream (KENALOG) 0.1 % Apply 1 Application topically 2 (two) times daily. Avoid use on face or private areas 10/27/23  Yes Particia Nearing, PA-C    Family History History reviewed. No pertinent family history.  Social History Social History   Tobacco Use   Smoking status: Former    Current packs/day: 0.00    Types: Cigarettes    Start date: 06/23/1980    Quit date: 06/23/2021    Years since quitting: 2.3   Smokeless tobacco: Never  Vaping Use   Vaping status: Every Day   Start date: 02/23/2022   Substances: Flavoring  Substance Use Topics   Alcohol use:  Not Currently   Drug use: Not Currently     Allergies   Patient has no known allergies.   Review of Systems Review of Systems Per HPI  Physical Exam Triage Vital Signs ED Triage Vitals  Encounter Vitals Group     BP 10/27/23 1003 139/79     Systolic BP Percentile --      Diastolic BP Percentile --      Pulse Rate 10/27/23 1003 (!) 52     Resp 10/27/23 1003 20     Temp 10/27/23 1003 98.5 F (36.9 C)     Temp Source 10/27/23 1003 Oral     SpO2 10/27/23 1003 97 %     Weight --      Height --      Head Circumference --      Peak Flow --      Pain Score 10/27/23 1004 0     Pain Loc --      Pain Education --      Exclude from Growth Chart --    No data found.  Updated Vital Signs BP 139/79 (BP Location: Right Arm)   Pulse (!) 52   Temp 98.5 F (36.9 C) (Oral)   Resp 20   SpO2 97%   Visual Acuity Right Eye Distance:   Left Eye Distance:   Bilateral Distance:  Right Eye Near:   Left Eye Near:    Bilateral Near:     Physical Exam Vitals and nursing note reviewed.  Constitutional:      Appearance: Normal appearance. She is not ill-appearing.  HENT:     Head: Atraumatic.  Eyes:     Extraocular Movements: Extraocular movements intact.     Conjunctiva/sclera: Conjunctivae normal.  Cardiovascular:     Rate and Rhythm: Normal rate and regular rhythm.     Heart sounds: Normal heart sounds.  Pulmonary:     Effort: Pulmonary effort is normal.     Breath sounds: Normal breath sounds.  Musculoskeletal:        General: Normal range of motion.     Cervical back: Normal range of motion and neck supple.  Skin:    General: Skin is warm and dry.     Findings: Rash present.     Comments: Mildly erythematous maculopapular rash widespread across extremities, torso sparing the face.  Neurological:     Mental Status: She is alert and oriented to person, place, and time.  Psychiatric:        Mood and Affect: Mood normal.        Thought Content: Thought content  normal.        Judgment: Judgment normal.      UC Treatments / Results  Labs (all labs ordered are listed, but only abnormal results are displayed) Labs Reviewed - No data to display  EKG   Radiology No results found.  Procedures Procedures (including critical care time)  Medications Ordered in UC Medications - No data to display  Initial Impression / Assessment and Plan / UC Course  I have reviewed the triage vital signs and the nursing notes.  Pertinent labs & imaging results that were available during my care of the patient were reviewed by me and considered in my medical decision making (see chart for details).     Possibly poison ivy type dermatitis.  Treat with prednisone taper, triamcinolone cream, antihistamines.  Follow-up for worsening symptoms.  Final Clinical Impressions(s) / UC Diagnoses   Final diagnoses:  Rash and nonspecific skin eruption   Discharge Instructions   None    ED Prescriptions     Medication Sig Dispense Auth. Provider   predniSONE (DELTASONE) 10 MG tablet Take 6 tabs daily x 2 days, 5 tabs daily x 2 days, 4 tabs daily x 2 days, etc 42 tablet Particia Nearing, PA-C   triamcinolone cream (KENALOG) 0.1 % Apply 1 Application topically 2 (two) times daily. Avoid use on face or private areas 80 g Particia Nearing, [place]      PDMP not reviewed this encounter.   Particia Nearing, [place] 10/27/23 1117

## 2023-10-27 NOTE — ED Triage Notes (Signed)
Itchy rash all over since yesterday.  Has taken benadryl last night.

## 2023-10-28 ENCOUNTER — Emergency Department (HOSPITAL_COMMUNITY)
Admission: EM | Admit: 2023-10-28 | Discharge: 2023-10-28 | Disposition: A | Discharge status: Home / Self Care | Payer: Self-pay | Attending: Emergency Medicine | Admitting: Emergency Medicine

## 2023-10-28 ENCOUNTER — Other Ambulatory Visit: Payer: Self-pay

## 2023-10-28 DIAGNOSIS — Z7952 Long term (current) use of systemic steroids: Secondary | ICD-10-CM | POA: Insufficient documentation

## 2023-10-28 DIAGNOSIS — R238 Other skin changes: Secondary | ICD-10-CM

## 2023-10-28 DIAGNOSIS — R21 Rash and other nonspecific skin eruption: Secondary | ICD-10-CM | POA: Insufficient documentation

## 2023-10-28 DIAGNOSIS — J449 Chronic obstructive pulmonary disease, unspecified: Secondary | ICD-10-CM | POA: Insufficient documentation

## 2023-10-28 MED ORDER — VALACYCLOVIR HCL 1 G PO TABS: 1000.0000 mg | ORAL_TABLET | Freq: Three times a day (TID) | ORAL | 0 refills | Status: AC

## 2023-10-28 NOTE — Discharge Instructions (Signed)
You may have chicken pox You can spread chickenpox starting 1-2 days before you get a rash. For as long as you have new spots or blisters that aren't scabs yet, stay away from: Pregnant people. Babies. People getting treatment for cancer. People with weak immune systems. People taking long-term steroids. Older people. People who haven't had chickenpox. People who haven't gotten the chickenpox vaccine. Stay home from work or school until all your blisters have crusted and there are no new spots, or for as long as told by your provider.  Please continue on the prednisone taper as prescribed by urgent care.  You may also use the triamcinolone cream as prescribed.  You may take 10 mg cetirizine (Zyrtec) daily which is an over-the-counter medication to help with itching. Do not take this with Benadryl.  You have been prescribed a medication called valacyclovir which treats the virus that causes chicken pox. Please take this as prescribed.  Return the ER for any severe blistering of your skin, lesions in your mouth, fever not controlled with tylenol, any other new or concerning symptoms.

## 2023-10-28 NOTE — ED Provider Notes (Signed)
[facility] AT [facility] Provider Note   CSN: [id number] Arrival date & time: 10/28/23  0840     History  Chief Complaint  Patient presents with   Rash   Pruritis    April Ford is a 57 y.o. female with COPD, presents with concern for a itchy rash that started yesterday.  States the rash started on her arms, and then has spread to her chest and upper legs.  Denies any involvement of mucosal surfaces, palms or soles.  Denies any other systemic symptoms such as fever, chills.  Denies any new medications, foods, soaps or detergents.  Was seen at urgent care yesterday and started on prednisone taper and triamcinolone cream.   Rash      Home Medications Prior to Admission medications   Medication Sig Start Date End Date Taking? Authorizing Provider  valACYclovir (VALTREX) 1000 MG tablet Take 1 tablet (1,000 mg total) by mouth 3 (three) times daily for 7 days. 10/28/23 11/04/23 Yes Arabella Merles, PA-C  predniSONE (DELTASONE) 10 MG tablet Take 6 tabs daily x 2 days, 5 tabs daily x 2 days, 4 tabs daily x 2 days, etc 10/27/23   Particia Nearing, PA-C  triamcinolone cream (KENALOG) 0.1 % Apply 1 Application topically 2 (two) times daily. Avoid use on face or private areas 10/27/23   Particia Nearing, PA-C      Allergies    Patient has no known allergies.    Review of Systems   Review of Systems  Skin:  Positive for rash.    Physical Exam Updated Vital Signs BP 109/77 (BP Location: Left Arm)   Pulse 65   Temp 98.3 F (36.8 C) (Oral)   Resp 17   Ht 5\' 7"  (1.702 m)   Wt 83.9 kg   SpO2 98%   BMI 28.98 kg/m  Physical Exam Skin:    Comments: Erythematous vesicles on the arms bilaterally, chest, thighs bilaterally.  No involvement of palms or soles, mucosal surfaces.          ED Results / Procedures / Treatments   Labs (all labs ordered are listed, but only abnormal results are displayed) Labs Reviewed - No data to  display  EKG None  Radiology No results found.  Procedures Procedures    Medications Ordered in ED Medications - No data to display  ED Course/ Medical Decision Making/ A&P                                 Medical Decision Making Risk Prescription drug management.     ED Course:  Patient overall well-appearing with normal vital signs.  She has a erythematous vesicular pruritic rash that is concern for possible varicella/chicken pox.  The rash is not unilateral, no concern for shingles at this time.  No involvement of palms or soles or mucosal surfaces, no skin peeling, no concern for syphillis, hand foot mouth, SJS, TENS at this time.  She has not started on any new medications, low concern for dress at this time.  This does not seem like a contact dermatitis given the vesicular nature and she denies any new soaps or lotions.  She was given prednisone and triamcinolone cream by urgent care, these have not made much improvement.  Will start patient on valacyclovir in addition to these medications.   Impression: Vesicular rash  Disposition:  The patient was discharged home with instructions to take  valacyclovir as prescribed.  Continue on prednisone and triamcinolone cream as prescribed by urgent care.  Cetirizine 10 mg daily for itching.  She was instructed to avoid contact with people until the lesions are scabbed over. Return precautions given.  External records from outside source obtained and reviewed including urgent care note from yesterday where she was prescribed prednisone, triamcinolone cream, instructed to take Benadryl for itching            Final Clinical Impression(s) / ED Diagnoses Final diagnoses:  Vesicular rash    Rx / DC Orders ED Discharge Orders          Ordered    valACYclovir (VALTREX) 1000 MG tablet  3 times daily        10/28/23 0945              Arabella Merles, PA-C 10/28/23 [id number]    Lonell Grandchild, MD 10/28/23  1455

## 2023-10-28 NOTE — ED Triage Notes (Signed)
Was seen in urgent care yesterday, They told patient she has poison Ivy, Given a script for prednisone Tablets and Triamcinolone cream.  Woke up Friday morning with the spots on arms and all over body.  7/10 pain and states it itches and is sore.

## 2023-11-01 ENCOUNTER — Encounter: Payer: Self-pay | Admitting: Family Medicine

## 2023-11-01 ENCOUNTER — Ambulatory Visit (INDEPENDENT_AMBULATORY_CARE_PROVIDER_SITE_OTHER): Payer: No Typology Code available for payment source | Admitting: Family Medicine

## 2023-11-01 VITALS — BP 118/76 | HR 69 | Temp 96.6°F | Ht 67.0 in | Wt 183.0 lb

## 2023-11-01 DIAGNOSIS — R238 Other skin changes: Secondary | ICD-10-CM | POA: Diagnosis not present

## 2023-11-01 NOTE — Progress Notes (Signed)
Subjective:  Patient ID: April Ford, female    DOB: May 02, 1966  Age: 57 y.o. MRN: [id number]  CC:   Chief Complaint  Patient presents with   chicken pox follow up    HPI:  57 year old female presents for follow-up regarding rash.  Recent seen in urgent care as well as the ER.  Diagnosed with a vesicular rash.  Concern for possible varicella.  She is on Valtrex and prednisone.  Rash is improving.  Patient is concerned given the fact that her daughter recently had surgery.  She is worried about transmitting rash.  She is otherwise doing well.  No other complaints or concerns at this time.  Patient Active Problem List   Diagnosis Date Noted   Decreased appetite 09/18/2023   IBS (irritable bowel syndrome) 06/16/2023   Former smoker 02/13/2023   COPD (chronic obstructive pulmonary disease) (HCC) 09/06/2022   Mixed hyperlipidemia 09/06/2022   Preventative health care 09/06/2022    Social Hx   Social History   Socioeconomic History   Marital status: Single    Spouse name: Not on file   Number of children: Not on file   Years of education: Not on file   Highest education level: Not on file  Occupational History   Not on file  Tobacco Use   Smoking status: Former    Current packs/day: 0.00    Types: Cigarettes    Start date: 06/23/1980    Quit date: 06/23/2021    Years since quitting: 2.3   Smokeless tobacco: Never  Vaping Use   Vaping status: Every Day   Start date: 02/23/2022   Substances: Flavoring  Substance and Sexual Activity   Alcohol use: Not Currently   Drug use: Not Currently   Sexual activity: Not Currently    Birth control/protection: None  Other Topics Concern   Not on file  Social History Narrative   Not on file   Social Determinants of Health   Financial Resource Strain: Not on file  Food Insecurity: Not on file  Transportation Needs: Not on file  Physical Activity: Not on file  Stress: Not on file  Social Connections: Not on file    Review of  Systems Per HPI  Objective:  BP 118/76   Pulse 69   Temp (!) 96.6 F (35.9 C)   Ht 5\' 7"  (1.702 m)   Wt 183 lb (83 kg)   SpO2 99%   BMI 28.66 kg/m      11/01/2023   11:02 AM 10/28/2023    9:03 AM 10/28/2023    8:59 AM  BP/Weight  Systolic BP 118  562  Diastolic BP 76  77  Wt. (Lbs) 183 185   BMI 28.66 kg/m2 28.98 kg/m2     Physical Exam Vitals and nursing note reviewed.  Constitutional:      General: She is not in acute distress.    Appearance: Normal appearance.  Cardiovascular:     Rate and Rhythm: Normal rate and regular rhythm.  Pulmonary:     Effort: Pulmonary effort is normal.     Breath sounds: Normal breath sounds.  Skin:    Comments: Rash appears to be improving.  She has numerous resolving papules with eschar on her upper extremities.  Neurological:     Mental Status: She is alert.     Lab Results  Component Value Date   WBC 11.3 (H) 08/23/2023   HGB 13.2 08/23/2023   HCT 39.8 08/23/2023   PLT 258 08/23/2023  GLUCOSE 101 (H) 08/23/2023   CHOL 184 09/05/2022   TRIG 192 (H) 09/05/2022   HDL 48 09/05/2022   LDLCALC 103 (H) 09/05/2022   ALT 12 08/23/2023   AST 18 08/23/2023   NA 137 08/23/2023   K 3.8 08/23/2023   CL 103 08/23/2023   CREATININE 0.82 08/23/2023   BUN 13 08/23/2023   CO2 24 08/23/2023     Assessment & Plan:   Vesicular rash Resolving.  Finish medications.  Supportive care.  Advised good hand hygiene.  Everlene Other DO [facility]

## 2023-11-01 NOTE — Patient Instructions (Signed)
Finish meds.  Wash hands well  Take care  Dr. Adriana Simas

## 2023-11-15 ENCOUNTER — Encounter (HOSPITAL_COMMUNITY)
Admission: RE | Admit: 2023-11-15 | Discharge: 2023-11-15 | Disposition: A | Discharge status: Home / Self Care | Payer: No Typology Code available for payment source | Source: Ambulatory Visit | Attending: Internal Medicine | Admitting: Internal Medicine

## 2023-11-15 ENCOUNTER — Encounter (HOSPITAL_COMMUNITY): Payer: Self-pay

## 2023-11-15 NOTE — Patient Instructions (Signed)
April Ford  11/15/2023     @PREFPERIOPPHARMACY @   Your procedure is scheduled on 11/20/2023.  Report to [name] at 8:30 A.M.  Call this number if you have problems the morning of surgery:  [phone]  If you experience any cold or flu symptoms such as cough, fever, chills, shortness of breath, etc. between now and your scheduled surgery, please notify us at the above number.   Remember:   Please Follow the diet and prep instructions given to you by Dr Queen Blossom office.    Take these medicines the morning of surgery with A SIP OF WATER : none    Do not wear jewelry, make-up or nail polish, including gel polish,  artificial nails, or any other type of covering on natural nails (fingers and  toes).  Do not wear lotions, powders, or perfumes, or deodorant.  Do not shave 48 hours prior to surgery.  Men may shave face and neck.  Do not bring valuables to the hospital.  [facility] is not responsible for any belongings or valuables.  Contacts, dentures or bridgework may not be worn into surgery.  Leave your suitcase in the car.  After surgery it may be brought to your room.  For patients admitted to the hospital, discharge time will be determined by your treatment team.  Patients discharged the day of surgery will not be allowed to drive home.   Name and phone number of your driver:   Family Special instructions:  N/a  Please read over the following fact sheets that you were given. Care and Recovery After Surgery  Colonoscopy, Adult A colonoscopy is a procedure to look at the entire large intestine. This procedure is done using a long, thin, flexible tube that has a camera on the end. You may have a colonoscopy: As a part of normal colorectal screening. If you have certain symptoms, such as: A low number of red blood cells in your blood (anemia). Diarrhea that does not go away. Pain in your abdomen. Blood in your stool. A colonoscopy can help screen for and diagnose  medical problems, including: An abnormal growth of cells or tissue (tumor). Abnormal growths within the lining of your intestine (polyps). Inflammation. Areas of bleeding. Tell your health care provider about: Any allergies you have. All medicines you are taking, including vitamins, herbs, eye drops, creams, and over-the-counter medicines. Any problems you or family members have had with anesthetic medicines. Any bleeding problems you have. Any surgeries you have had. Any medical conditions you have. Any problems you have had with having bowel movements. Whether you are pregnant or may be pregnant. What are the risks? Generally, this is a safe procedure. However, problems may occur, including: Bleeding. Damage to your intestine. Allergic reactions to medicines given during the procedure. Infection. This is rare. What happens before the procedure? Eating and drinking restrictions Follow instructions from your health care provider about eating or drinking restrictions, which may include: A few days before the procedure: Follow a low-fiber diet. Avoid nuts, seeds, dried fruit, raw fruits, and vegetables. 1-3 days before the procedure: Eat only gelatin dessert or ice pops. Drink only clear liquids, such as water, clear juice, clear broth or bouillon, black coffee or tea, or clear soft drinks or sports drinks. Avoid liquids that contain red or purple dye. The day of the procedure: Do not eat solid foods. You may continue to drink clear liquids until up to 2 hours before the procedure. Do not eat or drink anything  starting 2 hours before the procedure, or within the time period that your health care provider recommends. Bowel prep If you were prescribed a bowel prep to take by mouth (orally) to clean out your colon: Take it as told by your health care provider. Starting the day before your procedure, you will need to drink a large amount of liquid medicine. The liquid will cause you to  have many bowel movements of loose stool until your stool becomes almost clear or light green. If your skin or the opening between the buttocks (anus) gets irritated from diarrhea, you may relieve the irritation using: Wipes with medicine in them, such as adult wet wipes with aloe and vitamin E. A product to soothe skin, such as petroleum jelly. If you vomit while drinking the bowel prep: Take a break for up to 60 minutes. Begin the bowel prep again. Call your health care provider if you keep vomiting or you cannot take the bowel prep without vomiting. To clean out your colon, you may also be given: Laxative medicines. These help you have a bowel movement. Instructions for enema use. An enema is liquid medicine injected into your rectum. Medicines Ask your health care provider about: Changing or stopping your regular medicines or supplements. This is especially important if you are taking iron supplements, diabetes medicines, or blood thinners. Taking medicines such as aspirin and ibuprofen. These medicines can thin your blood. Do not take these medicines unless your health care provider tells you to take them. Taking over-the-counter medicines, vitamins, herbs, and supplements. General instructions Ask your health care provider what steps will be taken to help prevent infection. These may include washing skin with a germ-killing soap. If you will be going home right after the procedure, plan to have a responsible adult: Take you home from the hospital or clinic. You will not be allowed to drive. Care for you for the time you are told. What happens during the procedure?  An IV will be inserted into one of your veins. You will be given a medicine to make you fall asleep (general anesthetic). You will lie on your side with your knees bent. A lubricant will be put on the tube. Then the tube will be: Inserted into your anus. Gently eased through all parts of your large intestine. Air will be  sent into your colon to keep it open. This may cause some pressure or cramping. Images will be taken with the camera and will appear on a screen. A small tissue sample may be removed to be looked at under a microscope (biopsy). The tissue may be sent to a lab for testing if any signs of problems are found. If small polyps are found, they may be removed and checked for cancer cells. When the procedure is finished, the tube will be removed. The procedure may vary among health care providers and hospitals. What happens after the procedure? Your blood pressure, heart rate, breathing rate, and blood oxygen level will be monitored until you leave the hospital or clinic. You may have a small amount of blood in your stool. You may pass gas and have mild cramping or bloating in your abdomen. This is caused by the air that was used to open your colon during the exam. If you were given a sedative during the procedure, it can affect you for several hours. Do not drive or operate machinery until your health care provider says that it is safe. It is up to you to get the results  of your procedure. Ask your health care provider, or the department that is doing the procedure, when your results will be ready. Summary A colonoscopy is a procedure to look at the entire large intestine. Follow instructions from your health care provider about eating and drinking before the procedure. If you were prescribed an oral bowel prep to clean out your colon, take it as told by your health care provider. During the colonoscopy, a flexible tube with a camera on its end is inserted into the anus and then passed into all parts of the large intestine. This information is not intended to replace advice given to you by your health care provider. Make sure you discuss any questions you have with your health care provider. Document Revised: 01/24/2023 Document Reviewed: 08/04/2021 Elsevier Patient Education  2024 [employer]  Monitored Anesthesia Care Anesthesia refers to the techniques, procedures, and medicines that help a person stay safe and comfortable during surgery. Monitored anesthesia care, or sedation, is one type of anesthesia. You may have sedation if you do not need to be asleep for your procedure. Procedures that use sedation may include: Surgery to remove cataracts from your eyes. A dental procedure. A biopsy. This is when a tissue sample is removed and looked at under a microscope. You will be watched closely during your procedure. Your level of sedation or type of anesthesia may be changed to fit your needs. Tell a health care provider about: Any allergies you have. All medicines you are taking, including vitamins, herbs, eye drops, creams, and over-the-counter medicines. Any problems you or family members have had with anesthesia. Any bleeding problems you have. Any surgeries you have had. Any medical conditions or illnesses you have. This includes sleep apnea, cough, fever, or the flu. Whether you are pregnant or may be pregnant. Whether you use cigarettes, alcohol, or drugs. Any use of steroids, whether by mouth or as a cream. What are the risks? Your health care provider will talk with you about risks. These may include: Getting too much medicine (oversedation). Nausea. Allergic reactions to medicines. Trouble breathing. If this happens, a breathing tube may be used to help you breathe. It will be removed when you are awake and breathing on your own. Heart trouble. Lung trouble. Confusion that gets better with time (emergence delirium). What happens before the procedure? When to stop eating and drinking Follow instructions from your health care provider about what you may eat and drink. These may include: 8 hours before your procedure Stop eating most foods. Do not eat meat, fried foods, or fatty foods. Eat only light foods, such as toast or crackers. All liquids are okay except  energy drinks and alcohol. 6 hours before your procedure Stop eating. Drink only clear liquids, such as water, clear fruit juice, black coffee, plain tea, and sports drinks. Do not drink energy drinks or alcohol. 2 hours before your procedure Stop drinking all liquids. You may be allowed to take medicines with small sips of water. If you do not follow your health care provider's instructions, your procedure may be delayed or canceled. Medicines Ask your health care provider about: Changing or stopping your regular medicines. These include any diabetes medicines or blood thinners you take. Taking medicines such as aspirin and ibuprofen. These medicines can thin your blood. Do not take them unless your health care provider tells you to. Taking over-the-counter medicines, vitamins, herbs, and supplements. Testing You may have an exam or testing. You may have a blood or urine  sample taken. General instructions Do not use any products that contain nicotine or tobacco for at least 4 weeks before the procedure. These products include cigarettes, chewing tobacco, and vaping devices, such as e-cigarettes. If you need help quitting, ask your health care provider. If you will be going home right after the procedure, plan to have a responsible adult: Take you home from the hospital or clinic. You will not be allowed to drive. Care for you for the time you are told. What happens during the procedure?  Your blood pressure, heart rate, breathing, level of pain, and blood oxygen level will be monitored. An IV will be inserted into one of your veins. You may be given: A sedative. This helps you relax. Anesthesia. This will: Numb certain areas of your body. Make you fall asleep for surgery. You will be given medicines as needed to keep you comfortable. The more medicine you are given, the deeper your level of sedation will be. Your level of sedation may be changed to fit your needs. There are three  levels of sedation: Mild sedation. At this level, you may feel awake and relaxed. You will be able to follow directions. Moderate sedation. At this level, you will be sleepy. You may not remember the procedure. Deep sedation. At this level, you will be asleep. You will not remember the procedure. How you get the medicines will depend on your age and the procedure. They may be given as: A pill. This may be taken by mouth (orally) or inserted into the rectum. An injection. This may be into a vein or muscle. A spray through the nose. After your procedure is over, the medicine will be stopped. The procedure may vary among health care providers and hospitals. What happens after the procedure? Your blood pressure, heart rate, breathing rate, and blood oxygen level will be monitored until you leave the hospital or clinic. You may feel sleepy, clumsy, or nauseous. You may not remember what happened during or after the procedure. Sedation can affect you for several hours. Do not drive or use machinery until your health care provider says that it is safe. This information is not intended to replace advice given to you by your health care provider. Make sure you discuss any questions you have with your health care provider. Document Revised: 05/09/2022 Document Reviewed: 05/09/2022 Elsevier Patient Education  2024 [employer].

## 2023-11-20 ENCOUNTER — Telehealth: Payer: Self-pay | Admitting: *Deleted

## 2023-11-20 ENCOUNTER — Ambulatory Visit (HOSPITAL_COMMUNITY)
Admission: RE | Admit: 2023-11-20 | Discharge: 2023-11-20 | Disposition: A | Discharge status: Home / Self Care | Payer: No Typology Code available for payment source | Attending: Internal Medicine | Admitting: Internal Medicine

## 2023-11-20 ENCOUNTER — Encounter (HOSPITAL_COMMUNITY): Payer: Self-pay

## 2023-11-20 ENCOUNTER — Encounter (HOSPITAL_COMMUNITY)
Admission: RE | Disposition: A | Discharge status: Home / Self Care | Payer: Self-pay | Source: Home / Self Care | Attending: Internal Medicine

## 2023-11-20 ENCOUNTER — Ambulatory Visit (HOSPITAL_COMMUNITY): Payer: No Typology Code available for payment source | Admitting: Anesthesiology

## 2023-11-20 DIAGNOSIS — Z539 Procedure and treatment not carried out, unspecified reason: Secondary | ICD-10-CM | POA: Insufficient documentation

## 2023-11-20 DIAGNOSIS — Z1211 Encounter for screening for malignant neoplasm of colon: Secondary | ICD-10-CM | POA: Diagnosis present

## 2023-11-20 SURGERY — COLONOSCOPY WITH PROPOFOL
Anesthesia: General

## 2023-11-20 NOTE — Telephone Encounter (Signed)
Per endo :"Hey, patient has to be rescheduled. I told her that you would be in touch with her to get rescheduled. She had dinner @ 1900 last night then drank her prep."

## 2023-11-20 NOTE — Telephone Encounter (Signed)
Will call once we get Jan schedule

## 2023-11-20 NOTE — Final Progress Note (Signed)
Pt. Ate dinner @ 1900 11/19/23 then followed by prep @ 2200.  Informed Dr. Marletta Lor, instructed pt that if she isn't prepped properly that would need to reschedule or could go ahead with procedure.  Pt. stated that she would reschedule.  Message sent in [place] @ office to reschedule.  Instructed pt on importance of prep & diet instructions prior to procedure. Pt verbalized understanding.

## 2023-11-20 NOTE — Anesthesia Preprocedure Evaluation (Addendum)
Anesthesia Evaluation  Patient identified by MRN, date of birth, ID band Patient awake    Reviewed: Allergy & Precautions, H&P , NPO status , Patient's Chart, lab work & pertinent test results, reviewed documented beta blocker date and time   Airway        Dental   Pulmonary COPD, Patient abstained from smoking., former smoker bronchiolitis          Cardiovascular Exercise Tolerance: Good negative cardio ROS      Neuro/Psych negative neurological ROS  negative psych ROS   GI/Hepatic negative GI ROS, Neg liver ROS,,,  Endo/Other  negative endocrine ROS    Renal/GU negative Renal ROS  negative genitourinary   Musculoskeletal   Abdominal   Peds  Hematology negative hematology ROS (+)   Anesthesia Other Findings   Reproductive/Obstetrics negative OB ROS                             Anesthesia Physical Anesthesia Plan  ASA: 3  Anesthesia Plan: General   Post-op Pain Management: Minimal or no pain anticipated   Induction: Intravenous  PONV Risk Score and Plan: Propofol infusion  Airway Management Planned: Nasal Cannula and Natural Airway  Additional Equipment: None  Intra-op Plan:   Post-operative Plan:   Informed Consent: I have reviewed the patients History and Physical, chart, labs and discussed the procedure including the risks, benefits and alternatives for the proposed anesthesia with the patient or authorized representative who has indicated his/her understanding and acceptance.     Dental Advisory Given  Plan Discussed with: CRNA  Anesthesia Plan Comments:         Anesthesia Quick Evaluation

## 2023-11-30 ENCOUNTER — Telehealth (INDEPENDENT_AMBULATORY_CARE_PROVIDER_SITE_OTHER): Payer: Self-pay | Admitting: *Deleted

## 2023-11-30 NOTE — Telephone Encounter (Signed)
Called pt and she is aware  ?

## 2023-11-30 NOTE — Telephone Encounter (Signed)
Patient needs TCS with propofol ASA 3 WITH Dr. Marletta Lor Will call once we get Jan schedule. Also will need to remind patient she has to follow the clear liquid diet!

## 2023-11-30 NOTE — Telephone Encounter (Signed)
She left message on Tammy's line--she needs to reschedule her TCS please [phone]

## 2023-12-04 ENCOUNTER — Ambulatory Visit (HOSPITAL_COMMUNITY)
Admission: RE | Admit: 2023-12-04 | Discharge: 2023-12-04 | Disposition: A | Discharge status: Home / Self Care | Payer: No Typology Code available for payment source | Source: Ambulatory Visit | Attending: Family Medicine | Admitting: Family Medicine

## 2023-12-04 ENCOUNTER — Ambulatory Visit (INDEPENDENT_AMBULATORY_CARE_PROVIDER_SITE_OTHER): Payer: No Typology Code available for payment source | Admitting: Family Medicine

## 2023-12-04 ENCOUNTER — Other Ambulatory Visit (HOSPITAL_COMMUNITY)
Admission: RE | Admit: 2023-12-04 | Discharge: 2023-12-04 | Disposition: A | Discharge status: Home / Self Care | Payer: No Typology Code available for payment source | Source: Ambulatory Visit

## 2023-12-04 VITALS — BP 132/79 | HR 51 | Temp 97.3°F | Ht 67.0 in | Wt 183.4 lb

## 2023-12-04 DIAGNOSIS — M25551 Pain in right hip: Secondary | ICD-10-CM | POA: Diagnosis present

## 2023-12-04 DIAGNOSIS — N898 Other specified noninflammatory disorders of vagina: Secondary | ICD-10-CM | POA: Insufficient documentation

## 2023-12-04 MED ORDER — DICLOFENAC SODIUM 75 MG PO TBEC: 75.0000 mg | DELAYED_RELEASE_TABLET | Freq: Two times a day (BID) | ORAL | 0 refills | Status: DC | PRN

## 2023-12-04 NOTE — Assessment & Plan Note (Signed)
X-ray for further evaluation.  Diclofenac as directed.

## 2023-12-04 NOTE — Assessment & Plan Note (Signed)
Swab obtained today.  Awaiting results.

## 2023-12-04 NOTE — Patient Instructions (Signed)
Xray at the hospital.  Medication as directed.  We will call with swab results.  Take care  Dr. Adriana Simas

## 2023-12-04 NOTE — Progress Notes (Signed)
Subjective:  Patient ID: April Ford, female    DOB: 10-14-1966  Age: 57 y.o. MRN: [id number]  CC: Hip pain, vaginal discharge.  HPI:  57 year old female presents for evaluation of the above.  Patient reports a 4-day history of right inguinal pain.  Worse with activity/weightbearing.  No recent fall, trauma, injury.  She has done some lifting.  Patient also reports recent clear vaginal discharge.  No odor.  No pain.  She is not sexually active and has not been recently.  Patient Active Problem List   Diagnosis Date Noted   Right hip pain 12/04/2023   Vaginal discharge 12/04/2023   IBS (irritable bowel syndrome) 06/16/2023   Former smoker 02/13/2023   COPD (chronic obstructive pulmonary disease) (HCC) 09/06/2022   Mixed hyperlipidemia 09/06/2022   Preventative health care 09/06/2022    Social Hx   Social History   Socioeconomic History   Marital status: Single    Spouse name: Not on file   Number of children: Not on file   Years of education: Not on file   Highest education level: Not on file  Occupational History   Not on file  Tobacco Use   Smoking status: Former    Current packs/day: 0.00    Types: Cigarettes    Start date: 06/23/1980    Quit date: 06/23/2021    Years since quitting: 2.4   Smokeless tobacco: Never  Vaping Use   Vaping status: Every Day   Start date: 02/23/2022   Substances: Flavoring  Substance and Sexual Activity   Alcohol use: Not Currently   Drug use: Yes    Types: Marijuana   Sexual activity: Not Currently    Birth control/protection: None  Other Topics Concern   Not on file  Social History Narrative   Not on file   Social Determinants of Health   Financial Resource Strain: Not on file  Food Insecurity: Not on file  Transportation Needs: Not on file  Physical Activity: Not on file  Stress: Not on file  Social Connections: Not on file    Review of Systems Per HPI  Objective:  BP 132/79   Pulse (!) 51   Temp (!) 97.3 F  (36.3 C)   Ht 5\' 7"  (1.702 m)   Wt 183 lb 6.4 oz (83.2 kg)   SpO2 100%   BMI 28.72 kg/m      12/04/2023    1:16 PM 11/01/2023   11:02 AM 10/28/2023    9:03 AM  BP/Weight  Systolic BP 132 118   Diastolic BP 79 76   Wt. (Lbs) 183.4 183 185  BMI 28.72 kg/m2 28.66 kg/m2 28.98 kg/m2    Physical Exam Vitals and nursing note reviewed.  Constitutional:      General: She is not in acute distress.    Appearance: Normal appearance.  HENT:     Head: Normocephalic and atraumatic.  Pulmonary:     Effort: Pulmonary effort is normal. No respiratory distress.  Musculoskeletal:     Comments: Normal range of motion of the right hip.  Negative FADIR.  Neurological:     Mental Status: She is alert.     Lab Results  Component Value Date   WBC 11.3 (H) 08/23/2023   HGB 13.2 08/23/2023   HCT 39.8 08/23/2023   PLT 258 08/23/2023   GLUCOSE 101 (H) 08/23/2023   CHOL 184 09/05/2022   TRIG 192 (H) 09/05/2022   HDL 48 09/05/2022   LDLCALC 103 (H) 09/05/2022   ALT  12 08/23/2023   AST 18 08/23/2023   NA 137 08/23/2023   K 3.8 08/23/2023   CL 103 08/23/2023   CREATININE 0.82 08/23/2023   BUN 13 08/23/2023   CO2 24 08/23/2023     Assessment & Plan:   Problem List Items Addressed This Visit       Other   Right hip pain - Primary    X-ray for further evaluation.  Diclofenac as directed.      Relevant Orders   DG Hip Unilat W OR W/O Pelvis 2-3 Views Right   Vaginal discharge    Swab obtained today.  Awaiting results.      Relevant Orders   Cervicovaginal ancillary only    Meds ordered this encounter  Medications   diclofenac (VOLTAREN) 75 MG EC tablet    Sig: Take 1 tablet (75 mg total) by mouth 2 (two) times daily as needed for moderate pain (pain score 4-6).    Dispense:  30 tablet    Refill:  0    Emma Birchler DO [facility]

## 2023-12-05 ENCOUNTER — Encounter: Payer: Self-pay | Admitting: *Deleted

## 2023-12-05 NOTE — Telephone Encounter (Signed)
Spoke with pt. She has been scheduled for 1/31. Aware will let her know regarding new pre-op appt. She will come by office pick up clenpiq and instructions/pre-op. Aware she is not allowed to have any solid food the entire day prior. She has clear liquids list she can have. She voiced understanding.

## 2023-12-06 ENCOUNTER — Other Ambulatory Visit: Payer: Self-pay | Admitting: Family Medicine

## 2023-12-06 LAB — CERVICOVAGINAL ANCILLARY ONLY
Bacterial Vaginitis (gardnerella): POSITIVE — AB
Comment: NEGATIVE

## 2023-12-06 MED ORDER — METRONIDAZOLE 500 MG PO TABS
500.0000 mg | ORAL_TABLET | Freq: Two times a day (BID) | ORAL | 0 refills | Status: AC
Start: 2023-12-06 — End: 2023-12-13

## 2023-12-22 ENCOUNTER — Other Ambulatory Visit: Payer: Self-pay | Admitting: Family Medicine

## 2023-12-22 ENCOUNTER — Telehealth: Payer: Self-pay | Admitting: *Deleted

## 2023-12-22 MED ORDER — TRIAMCINOLONE ACETONIDE 0.5 % EX OINT: 1.0000 | TOPICAL_OINTMENT | Freq: Two times a day (BID) | CUTANEOUS | 0 refills | Status: DC

## 2023-12-22 NOTE — Telephone Encounter (Signed)
Patient notified

## 2023-12-22 NOTE — Telephone Encounter (Signed)
Tommie Sams, DO     Rx sent in.

## 2023-12-22 NOTE — Telephone Encounter (Signed)
Copied from CRM [id number]. Topic: Clinical - Medication Question >> Dec 22, 2023  9:31 AM Alphonzo Lemmings O wrote: Reason for CRM: patient is calling because she had chicken pox and now I'm just itching and he gave me some cream I can see them on my legs again and arms and they are itching bad . Wants to know can the Dr call patient in some more of that cream . Please give patient a call. She is itching real bad and they are coming back on legs and arms

## 2023-12-23 ENCOUNTER — Emergency Department (HOSPITAL_COMMUNITY)
Admission: EM | Admit: 2023-12-23 | Discharge: 2023-12-23 | Disposition: A | Discharge status: Home / Self Care | Payer: No Typology Code available for payment source | Attending: Emergency Medicine | Admitting: Emergency Medicine

## 2023-12-23 ENCOUNTER — Encounter (HOSPITAL_COMMUNITY): Payer: Self-pay

## 2023-12-23 ENCOUNTER — Other Ambulatory Visit: Payer: Self-pay

## 2023-12-23 DIAGNOSIS — R21 Rash and other nonspecific skin eruption: Secondary | ICD-10-CM | POA: Insufficient documentation

## 2023-12-23 DIAGNOSIS — J449 Chronic obstructive pulmonary disease, unspecified: Secondary | ICD-10-CM | POA: Insufficient documentation

## 2023-12-23 MED ORDER — PREDNISONE 20 MG PO TABS: ORAL_TABLET | ORAL | 0 refills | Status: DC

## 2023-12-23 NOTE — ED Triage Notes (Signed)
Pt arrives ambulatory to ED with reports of rash all over body starting yesterday pt is unsure of cause. Pt speaking in full sentences in triage and eating [employer].

## 2023-12-23 NOTE — ED Provider Notes (Signed)
[place] EMERGENCY DEPARTMENT AT [facility] Provider Note   CSN: [id number] Arrival date & time: 12/23/23  1426     History {Add pertinent medical, surgical, social history, OB history to HPI:1} Chief Complaint  Patient presents with   Rash    April Ford is a 57 y.o. female.  57 year old female with a history of COPD who presents emergency department with rash.  Patient presented to the emergency department on 10/28/2023 and was diagnosed with rash.  Thought to possibly be zoster.  Was started on steroids and Benadryl and reported that rash improved for a while but several days ago came up again.  Says it is on her arms and chest and back and legs.  Itching and painful.  Benadryl has not improved her symptoms so she decided to come into the emergency department.  Was also given triamcinolone cream yesterday by her primary doctor which she has been trying.  Thinks that she may be allergic to a detergent at her work but says that that does not explain it on her chest and abdomen.  Does have several other people at home as well as someone who shares her bed who does not have the rash.  No outdoor exposures.       Home Medications Prior to Admission medications   Medication Sig Start Date End Date Taking? Authorizing Provider  diclofenac (VOLTAREN) 75 MG EC tablet Take 1 tablet (75 mg total) by mouth 2 (two) times daily as needed for moderate pain (pain score 4-6). 12/04/23   Tommie Sams, DO  triamcinolone ointment (KENALOG) 0.5 % Apply 1 Application topically 2 (two) times daily. 12/22/23   Tommie Sams, DO      Allergies    Patient has no known allergies.    Review of Systems   Review of Systems  Physical Exam Updated Vital Signs BP (!) 143/91 (BP Location: Right Arm)   Pulse 63   Temp 99.1 F (37.3 C) (Oral)   Resp 18   Ht 5\' 7"  (1.702 m)   Wt 83 kg   SpO2 100%   BMI 28.66 kg/m  Physical Exam Vitals and nursing note reviewed.  Constitutional:       General: She is not in acute distress.    Appearance: She is well-developed.  HENT:     Head: Normocephalic and atraumatic.     Right Ear: External ear normal.     Left Ear: External ear normal.     Nose: Nose normal.  Eyes:     Extraocular Movements: Extraocular movements intact.     Conjunctiva/sclera: Conjunctivae normal.     Pupils: Pupils are equal, round, and reactive to light.  Pulmonary:     Effort: Pulmonary effort is normal. No respiratory distress.  Musculoskeletal:     Cervical back: Normal range of motion and neck supple.  Skin:    General: Skin is warm and dry.     Findings: Rash present.     Comments: Present over chest, abdomen, back, arms, and legs  Neurological:     Mental Status: She is alert and oriented to person, place, and time. Mental status is at baseline.  Psychiatric:        Mood and Affect: Mood normal.        ED Results / Procedures / Treatments   Labs (all labs ordered are listed, but only abnormal results are displayed) Labs Reviewed - No data to display  EKG None  Radiology No results found.  Procedures Procedures  {Document cardiac monitor, telemetry assessment procedure when appropriate:1}  Medications Ordered in ED Medications - No data to display  ED Course/ Medical Decision Making/ A&P   {   Click here for ABCD2, HEART and other calculatorsREFRESH Note before signing :1}                              Medical Decision Making Risk Prescription drug management.   ***  {Document critical care time when appropriate:1} {Document review of labs and clinical decision tools ie heart score, Chads2Vasc2 etc:1}  {Document your independent review of radiology images, and any outside records:1} {Document your discussion with family members, caretakers, and with consultants:1} {Document social determinants of health affecting pt's care:1} {Document your decision making why or why not admission, treatments were needed:1} Final  Clinical Impression(s) / ED Diagnoses Final diagnoses:  None    Rx / DC Orders ED Discharge Orders     None

## 2023-12-23 NOTE — Discharge Instructions (Signed)
You were seen for your rash in the emergency department.   At home, please take the prednisone taper we have prescribed you.  Take Benadryl in the evenings and Zyrtec during the day for your itching.    Check your MyChart online for the results of any tests that had not resulted by the time you left the emergency department.   Follow-up with your primary doctor in 2-3 days regarding your visit.  Follow-up with dermatology in 1 to 2 weeks about your rash.  Return immediately to the emergency department if you experience any of the following: Worsening pain, fevers, or any other concerning symptoms.    Thank you for visiting our Emergency Department. It was a pleasure taking care of you today.

## 2024-01-02 ENCOUNTER — Telehealth: Payer: Self-pay

## 2024-01-02 ENCOUNTER — Encounter: Payer: Self-pay | Admitting: Nurse Practitioner

## 2024-01-02 ENCOUNTER — Ambulatory Visit: Payer: No Typology Code available for payment source | Admitting: Nurse Practitioner

## 2024-01-02 VITALS — BP 113/73 | HR 59 | Temp 97.3°F | Ht 67.0 in | Wt 179.0 lb

## 2024-01-02 DIAGNOSIS — R21 Rash and other nonspecific skin eruption: Secondary | ICD-10-CM | POA: Diagnosis not present

## 2024-01-02 DIAGNOSIS — L282 Other prurigo: Secondary | ICD-10-CM | POA: Diagnosis not present

## 2024-01-02 MED ORDER — PREDNISONE 20 MG PO TABS: ORAL_TABLET | ORAL | 0 refills | Status: DC

## 2024-01-02 NOTE — Telephone Encounter (Signed)
 Left a message for the patient to inform of the following  Referring her to [employer] [place] at Dr. Scharlene Gloss office IF they accept her insurance

## 2024-01-02 NOTE — Progress Notes (Signed)
   Subjective:    Patient ID: April Ford, female    DOB: [birth year], 58 y.o.   MRN: [id number]  HPI Presents for recheck after being seen at ED for rash on 12/23/2023.  This is the second episode she has had.  The first 1 was in November.  Rash is mainly on the arms chest and back.  Slightly on the upper inner thighs.  The first time resolved without intervention.  Patient states that starts around her wrist area and then slowly spreads.  Very pruritic.  A picture of the rash is in the note from ED.  Was prescribed oral prednisone  which helped the rash resolve.  Continues to have some open areas on her arms.  No longer pruritic.  Has not identified any specific triggers.  No known contacts.  No changes in medications.  No changes in hygiene products or detergents.  Does not involve the hands or face.  No OTC herbals or supplements.  No known exposure to any triggers at either of her jobs.  No difficulty swallowing or breathing.   Review of Systems  Constitutional:  Negative for fever.  HENT:  Negative for congestion.   Respiratory:  Negative for cough, chest tightness, shortness of breath and wheezing.   Cardiovascular:  Negative for chest pain.  Musculoskeletal:  Negative for arthralgias and myalgias.  Skin:  Positive for rash.       Objective:   Physical Exam NAD.  Alert, oriented.  TMs mild clear effusion, no erythema.  Pharynx clear and moist.  Neck supple with mild soft anterior cervical adenopathy.  Lungs clear.  Heart regular rate rhythm.  Multiple discrete hyperpigmented macular areas noted on the arms with some superficial open areas, no evidence of infection.  No lesions are noted on the hands or face. Today's Vitals   01/02/24 0826  BP: 113/73  Pulse: (!) 59  Temp: (!) 97.3 F (36.3 C)  SpO2: 100%  Weight: 179 lb (81.2 kg)  Height: 5' 7 (1.702 m)   Body mass index is 28.04 kg/m.        Assessment & Plan:   Problem List Items Addressed This Visit        Musculoskeletal and Integument   Pruritic rash   Relevant Orders   Ambulatory referral to Dermatology   Rash and nonspecific skin eruption - Primary   Relevant Orders   Ambulatory referral to Dermatology   Meds ordered this encounter  Medications   predniSONE  (DELTASONE ) 20 MG tablet    Sig: Take 2 tabs (40 mg) once daily x 5 days prn rash    Dispense:  10 tablet    Refill:  0    Supervising Provider:   ALPHONSA HAMILTON A [[id number]]   Referred to dermatology. As a backup plan in the meantime in case there is another flareup, a 5-day prescription for prednisone  sent in.  Patient wishes to cut back on dosing from her previous prescription. With a flare up: Claritin 10 mg in the morning Benadryl  at night Generic Pepcid twice a day Start Prednisone  as directed Warning signs reviewed.  Call back in the meantime if any new or worsening symptoms.

## 2024-01-02 NOTE — Patient Instructions (Signed)
 With a flare up: Claritin 10 mg in the morning Benadryl at night Generic Pepcid twice a day Start Prednisone as directed

## 2024-01-15 ENCOUNTER — Telehealth: Payer: Self-pay

## 2024-01-15 NOTE — Telephone Encounter (Signed)
Reason for CRM: Patient called to inquire about referral status//Please advised if a dermatologist was found that accepts her insurance   Spoke with pt over the phone and gave her the number to [facility] dermatology, pt will call to schedule appt.

## 2024-01-22 ENCOUNTER — Encounter (HOSPITAL_COMMUNITY): Payer: Self-pay

## 2024-01-22 ENCOUNTER — Other Ambulatory Visit: Payer: Self-pay

## 2024-01-22 ENCOUNTER — Encounter (HOSPITAL_COMMUNITY)
Admission: RE | Admit: 2024-01-22 | Discharge: 2024-01-22 | Disposition: A | Discharge status: Home / Self Care | Payer: No Typology Code available for payment source | Source: Ambulatory Visit | Attending: Internal Medicine | Admitting: Internal Medicine

## 2024-01-22 NOTE — Pre-Procedure Instructions (Signed)
Attempted pre-op phonecall. Left VM for her to call us back.

## 2024-01-26 ENCOUNTER — Encounter (HOSPITAL_COMMUNITY)
Admission: RE | Disposition: A | Discharge status: Home / Self Care | Payer: Self-pay | Source: Home / Self Care | Attending: Internal Medicine

## 2024-01-26 ENCOUNTER — Ambulatory Visit (HOSPITAL_COMMUNITY): Payer: No Typology Code available for payment source | Admitting: Anesthesiology

## 2024-01-26 ENCOUNTER — Ambulatory Visit (HOSPITAL_COMMUNITY)
Admission: RE | Admit: 2024-01-26 | Discharge: 2024-01-26 | Disposition: A | Discharge status: Home / Self Care | Payer: No Typology Code available for payment source | Attending: Internal Medicine | Admitting: Internal Medicine

## 2024-01-26 ENCOUNTER — Encounter (HOSPITAL_COMMUNITY): Payer: Self-pay | Admitting: Internal Medicine

## 2024-01-26 DIAGNOSIS — Z1211 Encounter for screening for malignant neoplasm of colon: Secondary | ICD-10-CM | POA: Insufficient documentation

## 2024-01-26 DIAGNOSIS — J449 Chronic obstructive pulmonary disease, unspecified: Secondary | ICD-10-CM | POA: Diagnosis not present

## 2024-01-26 DIAGNOSIS — E782 Mixed hyperlipidemia: Secondary | ICD-10-CM | POA: Diagnosis not present

## 2024-01-26 HISTORY — PX: COLONOSCOPY WITH PROPOFOL: SHX5780

## 2024-01-26 SURGERY — COLONOSCOPY WITH PROPOFOL
Anesthesia: General

## 2024-01-26 MED ORDER — PROPOFOL 500 MG/50ML IV EMUL
INTRAVENOUS | Status: DC | PRN
  Administered 2024-01-26: 150 ug/kg/min via INTRAVENOUS
  Administered 2024-01-26: 100 mg via INTRAVENOUS

## 2024-01-26 MED ORDER — LACTATED RINGERS IV SOLN: INTRAVENOUS | Status: DC | PRN

## 2024-01-26 MED ORDER — STERILE WATER FOR IRRIGATION IR SOLN
Status: DC | PRN
  Administered 2024-01-26: 100 mL

## 2024-01-26 NOTE — Transfer of Care (Signed)
Immediate Anesthesia Transfer of Care Note  Patient: April Ford  Procedure(s) Performed: COLONOSCOPY WITH PROPOFOL  Patient Location: Short Stay  Anesthesia Type:General  Level of Consciousness: drowsy  Airway & Oxygen Therapy: Patient Spontanous Breathing  Post-op Assessment: Report given to RN and Post -op Vital signs reviewed and stable  Post vital signs: Reviewed and stable  Last Vitals:  Vitals Value Taken Time  BP 106/62 01/26/24 0931  Temp 36.4 C 01/26/24 0931  Pulse 60 01/26/24 0931  Resp 14 01/26/24 0931  SpO2 98 % 01/26/24 0931    Last Pain:  Vitals:   01/26/24 0931  TempSrc: Axillary  PainSc: 0-No pain         Complications: No notable events documented.

## 2024-01-26 NOTE — Discharge Instructions (Addendum)
  Colonoscopy Discharge Instructions  Read the instructions outlined below and refer to this sheet in the next few weeks. These discharge instructions provide you with general information on caring for yourself after you leave the hospital. Your doctor may also give you specific instructions. While your treatment has been planned according to the most current medical practices available, unavoidable complications occasionally occur.   ACTIVITY You may resume your regular activity, but move at a slower pace for the next 24 hours.  Take frequent rest periods for the next 24 hours.  Walking will help get rid of the air and reduce the bloated feeling in your belly (abdomen).  No driving for 24 hours (because of the medicine (anesthesia) used during the test).   Do not sign any important legal documents or operate any machinery for 24 hours (because of the anesthesia used during the test).  NUTRITION Drink plenty of fluids.  You may resume your normal diet as instructed by your doctor.  Begin with a light meal and progress to your normal diet. Heavy or fried foods are harder to digest and may make you feel sick to your stomach (nauseated).  Avoid alcoholic beverages for 24 hours or as instructed.  MEDICATIONS You may resume your normal medications unless your doctor tells you otherwise.  WHAT YOU CAN EXPECT TODAY Some feelings of bloating in the abdomen.  Passage of more gas than usual.  Spotting of blood in your stool or on the toilet paper.  IF YOU HAD POLYPS REMOVED DURING THE COLONOSCOPY: No aspirin products for 7 days or as instructed.  No alcohol for 7 days or as instructed.  Eat a soft diet for the next 24 hours.  FINDING OUT THE RESULTS OF YOUR TEST Not all test results are available during your visit. If your test results are not back during the visit, make an appointment with your caregiver to find out the results. Do not assume everything is normal if you have not heard from your  caregiver or the medical facility. It is important for you to follow up on all of your test results.  SEEK IMMEDIATE MEDICAL ATTENTION IF: You have more than a spotting of blood in your stool.  Your belly is swollen (abdominal distention).  You are nauseated or vomiting.  You have a temperature over 101.  You have abdominal pain or discomfort that is severe or gets worse throughout the day.   Unfortunately, your colon was not adequately prepped today for colonoscopy.  I did not see any evidence of colon cancer or large polyps, but certainly could have missed smaller polyps due to poor visualization.  Would recommend repeat colonoscopy in 3-6 months with a different colon prep. Follow up in office in 6-8 weeks to discuss further   I hope you have a great rest of your week!  Hennie Duos. Marletta Lor, D.O. Gastroenterology and Hepatology [facility]

## 2024-01-26 NOTE — ED Notes (Signed)
  January 26, 2024  Patient: April Ford  Date of Birth: October 25, 1966  Date of Visit: 01/26/2024    To Whom It May Concern:  [name] was seen and treated in our surgery center on 01/26/2024. [name]  may return to work on 01/27/2024 .  Sincerely,   Jimmie Molly RN

## 2024-01-26 NOTE — Anesthesia Preprocedure Evaluation (Signed)
Anesthesia Evaluation  Patient identified by MRN, date of birth, ID band Patient awake    Reviewed: Allergy & Precautions, H&P , NPO status , Patient's Chart, lab work & pertinent test results, reviewed documented beta blocker date and time   Airway Mallampati: II  TM Distance: >3 FB Neck ROM: full    Dental no notable dental hx.    Pulmonary neg pulmonary ROS, COPD, Patient abstained from smoking., former smoker   Pulmonary exam normal breath sounds clear to auscultation       Cardiovascular Exercise Tolerance: Good hypertension, negative cardio ROS  Rhythm:regular Rate:Normal     Neuro/Psych negative neurological ROS  negative psych ROS   GI/Hepatic negative GI ROS, Neg liver ROS,,,  Endo/Other  negative endocrine ROS    Renal/GU negative Renal ROS  negative genitourinary   Musculoskeletal   Abdominal   Peds  Hematology negative hematology ROS (+)   Anesthesia Other Findings   Reproductive/Obstetrics negative OB ROS                             Anesthesia Physical Anesthesia Plan  ASA: 2  Anesthesia Plan: General   Post-op Pain Management:    Induction:   PONV Risk Score and Plan: Propofol infusion  Airway Management Planned:   Additional Equipment:   Intra-op Plan:   Post-operative Plan:   Informed Consent: I have reviewed the patients History and Physical, chart, labs and discussed the procedure including the risks, benefits and alternatives for the proposed anesthesia with the patient or authorized representative who has indicated his/her understanding and acceptance.     Dental Advisory Given  Plan Discussed with: CRNA  Anesthesia Plan Comments:        Anesthesia Quick Evaluation

## 2024-01-26 NOTE — H&P (Signed)
Primary Care Physician:  Tommie Sams, DO Primary Gastroenterologist:  Dr. Marletta Lor  Pre-Procedure History & Physical: HPI:  April Ford is a 58 y.o. female is here for a colonoscopy for colon cancer screening purposes.   Past Medical History:  Diagnosis Date   Bronchiolitis    COPD (chronic obstructive pulmonary disease) (HCC)     Past Surgical History:  Procedure Laterality Date   ABDOMINAL HYSTERECTOMY      Prior to Admission medications   Medication Sig Start Date End Date Taking? Authorizing Provider  predniSONE (DELTASONE) 20 MG tablet Take 2 tabs (40 mg) once daily x 5 days prn rash 01/02/24  Yes Sherie Don C, NP  triamcinolone ointment (KENALOG) 0.5 % Apply 1 Application topically 2 (two) times daily. 12/22/23  Yes Cook, [name], DO  diclofenac (VOLTAREN) 75 MG EC tablet Take 1 tablet (75 mg total) by mouth 2 (two) times daily as needed for moderate pain (pain score 4-6). 12/04/23   Tommie Sams, DO    Allergies as of 12/05/2023   (No Known Allergies)    History reviewed. No pertinent family history.  Social History   Socioeconomic History   Marital status: Single    Spouse name: Not on file   Number of children: Not on file   Years of education: Not on file   Highest education level: Not on file  Occupational History   Not on file  Tobacco Use   Smoking status: Former    Current packs/day: 0.00    Types: Cigarettes    Start date: 06/23/1980    Quit date: 06/23/2021    Years since quitting: 2.5   Smokeless tobacco: Never  Vaping Use   Vaping status: Some Days   Start date: 02/23/2022   Substances: Flavoring  Substance and Sexual Activity   Alcohol use: Not Currently   Drug use: Yes    Types: Marijuana   Sexual activity: Not Currently    Birth control/protection: None  Other Topics Concern   Not on file  Social History Narrative   Not on file   Social Drivers of Health   Financial Resource Strain: Not on file  Food Insecurity: Not on file   Transportation Needs: Not on file  Physical Activity: Not on file  Stress: Not on file  Social Connections: Not on file  Intimate Partner Violence: Not on file    Review of Systems: See HPI, otherwise negative ROS  Physical Exam: Vital signs in last 24 hours: Temp:  [98.1 F (36.7 C)] 98.1 F (36.7 C) (01/31 0835) Pulse Rate:  [56] 56 (01/31 0835) Resp:  [14] 14 (01/31 0835) BP: (138)/(90) 138/90 (01/31 0835) SpO2:  [100 %] 100 % (01/31 0835) Weight:  [81.1 kg] 81.1 kg (01/31 0835)   General:   Alert,  Well-developed, well-nourished, pleasant and cooperative in NAD Head:  Normocephalic and atraumatic. Eyes:  Sclera clear, no icterus.   Conjunctiva pink. Ears:  Normal auditory acuity. Nose:  No deformity, discharge,  or lesions. Msk:  Symmetrical without gross deformities. Normal posture. Extremities:  Without clubbing or edema. Neurologic:  Alert and  oriented x4;  grossly normal neurologically. Skin:  Intact without significant lesions or rashes. Psych:  Alert and cooperative. Normal mood and affect.  Impression/Plan: April Ford is here for a colonoscopy to be performed for colon cancer screening purposes.  The risks of the procedure including infection, bleed, or perforation as well as benefits, limitations, alternatives and imponderables have been reviewed with the patient.  Questions have been answered. All parties agreeable.

## 2024-01-26 NOTE — Anesthesia Postprocedure Evaluation (Signed)
Anesthesia Post Note  Patient: April Ford  Procedure(s) Performed: COLONOSCOPY WITH PROPOFOL  Patient location during evaluation: Phase II Anesthesia Type: General Level of consciousness: awake Pain management: pain level controlled Vital Signs Assessment: post-procedure vital signs reviewed and stable Respiratory status: spontaneous breathing and respiratory function stable Cardiovascular status: blood pressure returned to baseline and stable Postop Assessment: no headache and no apparent nausea or vomiting Anesthetic complications: no Comments: Late entry   No notable events documented.   Last Vitals:  Vitals:   01/26/24 0835 01/26/24 0931  BP: (!) 138/90 106/62  Pulse: (!) 56 60  Resp: 14 14  Temp: 36.7 C 36.4 C  SpO2: 100% 98%    Last Pain:  Vitals:   01/26/24 0931  TempSrc: Axillary  PainSc: 0-No pain                 Windell Norfolk

## 2024-01-26 NOTE — Op Note (Signed)
[facility] Patient Name: April Ford Procedure Date: 01/26/2024 9:07 AM MRN: [id number] Date of Birth: 04-09-1966 Attending MD: Hennie Duos. Marletta Lor , [place], [id number] CSN: [id number] Age: 58 Admit Type: Outpatient Procedure:                Colonoscopy Indications:              Screening for colorectal malignant neoplasm Providers:                Hennie Duos. Marletta Lor, DO, Crystal Page, Dyann Ruddle Referring MD:              Medicines:                See the Anesthesia note for documentation of the                            administered medications Complications:            No immediate complications. Estimated Blood Loss:     Estimated blood loss: none. Procedure:                Pre-Anesthesia Assessment:                           - The anesthesia plan was to use monitored                            anesthesia care (MAC).                           After obtaining informed consent, the colonoscope                            was passed under direct vision. Throughout the                            procedure, the patient's blood pressure, pulse, and                            oxygen saturations were monitored continuously. The                            [id number]-HQ190L ([id number]) scope was introduced through                            the anus and advanced to the the cecum, identified                            by appendiceal orifice and ileocecal valve. The                            colonoscopy was performed without difficulty. The                            patient tolerated the procedure well. The quality                            of  the bowel preparation was evaluated using the                            BBPS [facility] Bowel Preparation Scale) with scores                            of: Right Colon = 1 (portion of mucosa seen, but                            other areas not well seen due to staining, residual                            stool and/or opaque liquid), Transverse Colon = 1                             (portion of mucosa seen, but other areas not well                            seen due to staining, residual stool and/or opaque                            liquid) and Left Colon = 2 (minor amount of                            residual staining, small fragments of stool and/or                            opaque liquid, but mucosa seen well). The total                            BBPS score equals 4. The quality of the bowel                            preparation was inadequate. Scope In: 9:21:38 AM Scope Out: 9:28:13 AM Scope Withdrawal Time: 0 hours 2 minutes 9 seconds  Total Procedure Duration: 0 hours 6 minutes 35 seconds  Findings:      A large amount of semi-solid and solid stool was found in the entire       colon, precluding visualization. Impression:               - Preparation of the colon was inadequate.                           - Stool in the entire examined colon.                           - No specimens collected. Moderate Sedation:      Per Anesthesia Care Recommendation:           - Patient has a contact number available for                            emergencies. The signs and symptoms of potential  delayed complications were discussed with the                            patient. Return to normal activities tomorrow.                            Written discharge instructions were provided to the                            patient.                           - Resume previous diet.                           - Continue present medications.                           - Repeat colonoscopy in 3-6 months because the                            bowel preparation was poor.                           - Return to GI clinic in 6 weeks. Procedure Code(s):        --- Professional ---                           Z6109, Colorectal cancer screening; colonoscopy on                            individual not meeting criteria for high risk Diagnosis  Code(s):        --- Professional ---                           Z12.11, Encounter for screening for malignant                            neoplasm of colon CPT copyright 2022 American Medical Association. All rights reserved. The codes documented in this report are preliminary and upon coder review may  be revised to meet current compliance requirements. Hennie Duos. Marletta Lor, DO Hennie Duos. Georgeanne Frankland, DO 01/26/2024 9:31:26 AM This report has been signed electronically. Number of Addenda: 0

## 2024-01-29 ENCOUNTER — Encounter (HOSPITAL_COMMUNITY): Payer: Self-pay | Admitting: Internal Medicine

## 2024-02-05 ENCOUNTER — Encounter: Payer: Self-pay | Admitting: *Deleted

## 2024-02-16 ENCOUNTER — Other Ambulatory Visit: Payer: Self-pay

## 2024-02-16 ENCOUNTER — Ambulatory Visit: Payer: Self-pay | Admitting: Family Medicine

## 2024-02-16 DIAGNOSIS — R21 Rash and other nonspecific skin eruption: Secondary | ICD-10-CM

## 2024-02-16 NOTE — Telephone Encounter (Signed)
Spoke with pt she has been having this itchy rash that comes and goes for a month now and has not heard anything from referral to dermatology, looks like they didn't take her insurance. Will resubmit referral, pt is asking for a refill for prednisone. Please advise

## 2024-02-16 NOTE — Telephone Encounter (Signed)
  Chief Complaint: rash Symptoms: itchy red bumps all over Frequency: ongoing for months, was resolved, now restarting Pertinent Negatives: Patient denies fever, chicken pox hx, SOB,   Disposition: [] ED /[x] Urgent Care (no appt availability in office) / [] Appointment(In office/virtual)/ []  [place] [facility]/ [] Home Care/ [] Refused Recommended Disposition /[] [place] [facility]/ []  Follow-up with PCP  Additional Notes: Pt states that she has been suffering from these itchy bumps widespread over her entire body. Pt states that this started a few months ago and she was given prednisone and it helped. She was told if it happens again that she should use prednisone again, however, she is not out of the medication. Pt states that they look like chicken pox but "I know they aren't". Pt states that she has never had chicken pox. Denies fever, denies SOB. Pt states the itching is severe. Attempted to sched pt today, but she has other prior commitments and she was unable to accept any appt times that were offered today. Pt advised to utilize UC, pt agreeable.  Copied from CRM [id number]. Topic: Clinical - Red Word Triage >> Feb 16, 2024  8:16 AM Gildardo Pounds wrote: [facility] that prompted transfer to Nurse Triage: rash all over coming back, cream and prednisone stops the itch but not removing the rash. callback number is [phone] Reason for Disposition  SEVERE itching (i.e., interferes with sleep, normal activities or school)  Answer Assessment - Initial Assessment Questions 1. APPEARANCE of RASH: "Describe the rash." (e.g., spots, blisters, raised areas, skin peeling, scaly)     Little bumps, red, pt states they look like chicken pox, "but I'm to old for chicken pox" 2. SIZE: "How big are the spots?" (e.g., tip of pen, eraser, coin; inches, centimeters)     All different sizes, some little, some big 3. LOCATION: "Where is the rash located?"     All over 4. COLOR: "What color is the rash?" (Note: It  is difficult to assess rash color in people with darker-colored skin. When this situation occurs, simply ask the caller to describe what they see.)     red 5. ONSET: "When did the rash begin?"     Ongoing for months, was resolved, now recurring,  6. FEVER: "Do you have a fever?" If Yes, ask: "What is your temperature, how was it measured, and when did it start?"     denies 7. ITCHING: "Does the rash itch?" If Yes, ask: "How bad is the itch?" (Scale 1-10; or mild, moderate, severe)     severe 8. CAUSE: "What do you think is causing the rash?"     First they said measles, then chicken pox, they don't know it seems 9. MEDICINE FACTORS: "Have you started any new medicines within the last 2 weeks?" (e.g., antibiotics)      denies 10. OTHER SYMPTOMS: "Do you have any other symptoms?" (e.g., dizziness, headache, sore throat, joint pain)       denies  Protocols used: Rash or Redness - [facility]

## 2024-02-19 ENCOUNTER — Ambulatory Visit (INDEPENDENT_AMBULATORY_CARE_PROVIDER_SITE_OTHER): Payer: No Typology Code available for payment source | Admitting: Family Medicine

## 2024-02-19 VITALS — BP 132/80 | HR 61 | Temp 97.9°F | Ht 67.0 in | Wt 177.0 lb

## 2024-02-19 DIAGNOSIS — L282 Other prurigo: Secondary | ICD-10-CM | POA: Diagnosis not present

## 2024-02-19 MED ORDER — CLOBETASOL PROPIONATE 0.05 % EX OINT: 1.0000 | TOPICAL_OINTMENT | Freq: Two times a day (BID) | CUTANEOUS | 0 refills | Status: DC

## 2024-02-19 MED ORDER — HYDROXYZINE PAMOATE 25 MG PO CAPS: 25.0000 mg | ORAL_CAPSULE | Freq: Three times a day (TID) | ORAL | 1 refills | Status: DC | PRN

## 2024-02-19 NOTE — Progress Notes (Signed)
 Subjective:  Patient ID: April Ford, female    DOB: 1966-02-25  Age: 58 y.o. MRN: [id number]  CC:   Chief Complaint  Patient presents with   rash ongoing on and off    Multiple months    HPI:  58 year old female presents with ongoing rash.  Pruritic rash.  Affects the upper extremities, abdomen, and chest and back.  Has had some improvement with prior use of prednisone.  However, continues to be quite troublesome.  Very itchy.  No known inciting factor.  Seems to be exacerbated with scratching.  Has not seen dermatology yet.  She was offered an appointment in August.  Patient Active Problem List   Diagnosis Date Noted   Pruritic rash 01/02/2024   Right hip pain 12/04/2023   Vaginal discharge 12/04/2023   IBS (irritable bowel syndrome) 06/16/2023   Former smoker 02/13/2023   COPD (chronic obstructive pulmonary disease) (HCC) 09/06/2022   Mixed hyperlipidemia 09/06/2022   Preventative health care 09/06/2022    Social Hx   Social History   Socioeconomic History   Marital status: Single    Spouse name: Not on file   Number of children: Not on file   Years of education: Not on file   Highest education level: Not on file  Occupational History   Not on file  Tobacco Use   Smoking status: Former    Current packs/day: 0.00    Types: Cigarettes    Start date: 06/23/1980    Quit date: 06/23/2021    Years since quitting: 2.6   Smokeless tobacco: Never  Vaping Use   Vaping status: Some Days   Start date: 02/23/2022   Substances: Flavoring  Substance and Sexual Activity   Alcohol use: Not Currently   Drug use: Yes    Types: Marijuana   Sexual activity: Not Currently    Birth control/protection: None  Other Topics Concern   Not on file  Social History Narrative   Not on file   Social Drivers of Health   Financial Resource Strain: Not on file  Food Insecurity: Not on file  Transportation Needs: Not on file  Physical Activity: Not on file  Stress: Not on file   Social Connections: Not on file    Review of Systems Per HPI  Objective:  BP 132/80   Pulse 61   Temp 97.9 F (36.6 C)   Ht 5\' 7"  (1.702 m)   Wt 177 lb (80.3 kg)   SpO2 99%   BMI 27.72 kg/m      02/19/2024   10:16 AM 01/26/2024    9:31 AM 01/26/2024    8:35 AM  BP/Weight  Systolic BP 132 106 138  Diastolic BP 80 62 90  Wt. (Lbs) 177  178.79  BMI 27.72 kg/m2  28 kg/m2    Physical Exam Vitals and nursing note reviewed.  Constitutional:      General: She is not in acute distress. HENT:     Head: Normocephalic and atraumatic.  Pulmonary:     Effort: Pulmonary effort is normal. No respiratory distress.  Skin:    Comments: Upper extremities, abdomen, and back with hyperpigmented areas with small eschars.  Mild erythema.  Neurological:     Mental Status: She is alert.     Lab Results  Component Value Date   WBC 11.3 (H) 08/23/2023   HGB 13.2 08/23/2023   HCT 39.8 08/23/2023   PLT 258 08/23/2023   GLUCOSE 101 (H) 08/23/2023   CHOL 184 09/05/2022  TRIG 192 (H) 09/05/2022   HDL 48 09/05/2022   LDLCALC 103 (H) 09/05/2022   ALT 12 08/23/2023   AST 18 08/23/2023   NA 137 08/23/2023   K 3.8 08/23/2023   CL 103 08/23/2023   CREATININE 0.82 08/23/2023   BUN 13 08/23/2023   CO2 24 08/23/2023     Assessment & Plan:  Pruritic rash Assessment & Plan: Etiology unclear at this time.  Atarax as directed.  Clobetasol as directed as well.  Advised to reach out to [facility] dermatology   Other orders -     hydrOXYzine Pamoate; Take 1 capsule (25 mg total) by mouth every 8 (eight) hours as needed for itching.  Dispense: 90 capsule; Refill: 1 -     Clobetasol Propionate; Apply 1 Application topically 2 (two) times daily.  Dispense: 30 g; Refill: 0    Follow-up: Has follow-up scheduled  Cheng Dec Adriana Simas DO [facility]

## 2024-02-19 NOTE — Assessment & Plan Note (Signed)
 Etiology unclear at this time.  Atarax as directed.  Clobetasol as directed as well.  Advised to reach out to [facility] dermatology

## 2024-02-19 NOTE — Patient Instructions (Addendum)
 Medications as prescribed.  Try [facility] [phone]

## 2024-03-11 ENCOUNTER — Encounter: Payer: Self-pay | Admitting: Gastroenterology

## 2024-03-12 ENCOUNTER — Other Ambulatory Visit: Payer: Self-pay | Admitting: Family Medicine

## 2024-03-17 ENCOUNTER — Other Ambulatory Visit: Payer: Self-pay | Admitting: Family Medicine

## 2024-03-18 ENCOUNTER — Ambulatory Visit: Payer: 59 | Admitting: Family Medicine

## 2024-03-19 ENCOUNTER — Encounter: Payer: Self-pay | Admitting: Family Medicine

## 2024-05-01 ENCOUNTER — Ambulatory Visit: Payer: Self-pay

## 2024-05-01 NOTE — Telephone Encounter (Signed)
 Cook, [name], DO     I don't recommend continued use of prednisone . Recommend dermatology evaluation.

## 2024-05-01 NOTE — Telephone Encounter (Signed)
  Chief Complaint: "break out" Symptoms: itching, rash Frequency: intermittent Pertinent Negatives: Patient denies fever, all other symptoms Disposition: [] ED /[] Urgent Care (no appt availability in office) / [] Appointment(In office/virtual)/ []  [place] [facility]/ [] Home Care/ [] Refused Recommended Disposition /[] [place] [facility]/ [x]  Follow-up with PCP Additional Notes:  Bilateral arms, chest, stomach, and back is "breaking out" for 3 months. She has had several evaluation for recurring skin rash. "Dr. Debrah Fan several times, UC and ER several times", "nobody knows what it is". She has prednisone  from a previous script she took two doses and now rash is cleared. Used Benadryl  cream  to help calm itching. She has not had any new products or foods. Itching is intense at times. Same break outs as normal. She feels the need to wear long sleeve shirts due to embarrassment, notes that covering her arms is making her warm and causes the rash to flair. Evaluation advised, she declines at this time because the prednisone  cleared the flair and nothing to see, she would like to know if Dr. Debrah Fan could send in a prescription for prednisone  that she can use during flair up. She does state that if Dr. Debrah Fan wants her to come in for evaluation she is willing but wants him to know he wouldn't see anything at this time. Please follow up with Dhriti re: prednisone  request for chronic condition.  Reason for Disposition  Mild widespread rash  (Exception: Heat rash lasting 3 days or less.)  Protocols used: Rash or Redness - [facility]

## 2024-05-01 NOTE — Telephone Encounter (Signed)
 RN made a third attempt to contact the pt. Pt did not answer, RN LVM with a callback number. Will route to the office for follow-up. Reason for Disposition . Third attempt to contact caller AND no contact made. Phone number verified.  Protocols used: No Contact or Duplicate Contact Call-A-AH

## 2024-05-01 NOTE — Telephone Encounter (Signed)
 This RN made a second attempt to contact the pt. Pt did not answer. RN LVM with callback number.

## 2024-05-01 NOTE — Telephone Encounter (Signed)
 This RN made the first attempt to contact the pt. Pt did not answer. RN LVM with a callback number.   Copied from CRM [id number]. Topic: Clinical - Medication Question >> May 01, 2024 10:55 AM April Ford wrote: Reason for CRM: patient is calling because her arms are still breaking out again and the rx is not working can she get a new rx  please call patient  [phone]

## 2024-06-30 ENCOUNTER — Encounter (HOSPITAL_COMMUNITY): Payer: Self-pay | Admitting: *Deleted

## 2024-06-30 ENCOUNTER — Emergency Department (HOSPITAL_COMMUNITY)
Admission: EM | Admit: 2024-06-30 | Discharge: 2024-06-30 | Disposition: A | Discharge status: Home / Self Care | Source: Ambulatory Visit | Attending: Emergency Medicine | Admitting: Emergency Medicine

## 2024-06-30 ENCOUNTER — Other Ambulatory Visit: Payer: Self-pay

## 2024-06-30 DIAGNOSIS — J449 Chronic obstructive pulmonary disease, unspecified: Secondary | ICD-10-CM | POA: Insufficient documentation

## 2024-06-30 DIAGNOSIS — R519 Headache, unspecified: Secondary | ICD-10-CM | POA: Diagnosis present

## 2024-06-30 LAB — SEDIMENTATION RATE: Sed Rate: 13 mm/h (ref 0–30)

## 2024-06-30 MED ORDER — DIPHENHYDRAMINE HCL 50 MG/ML IJ SOLN
25.0000 mg | Freq: Once | INTRAMUSCULAR | Status: AC
  Administered 2024-06-30: 25 mg via INTRAVENOUS
  Filled 2024-06-30: qty 1

## 2024-06-30 MED ORDER — DEXAMETHASONE SODIUM PHOSPHATE 10 MG/ML IJ SOLN
10.0000 mg | Freq: Once | INTRAMUSCULAR | Status: AC
  Administered 2024-06-30: 10 mg via INTRAVENOUS
  Filled 2024-06-30: qty 1

## 2024-06-30 MED ORDER — NAPROXEN 375 MG PO TABS
375.0000 mg | ORAL_TABLET | Freq: Two times a day (BID) | ORAL | 0 refills | Status: DC | PRN
Start: 2024-06-30 — End: 2024-07-08

## 2024-06-30 MED ORDER — KETOROLAC TROMETHAMINE 15 MG/ML IJ SOLN
15.0000 mg | Freq: Once | INTRAMUSCULAR | Status: AC
  Administered 2024-06-30: 15 mg via INTRAVENOUS
  Filled 2024-06-30: qty 1

## 2024-06-30 MED ORDER — METOCLOPRAMIDE HCL 5 MG/ML IJ SOLN
10.0000 mg | Freq: Once | INTRAMUSCULAR | Status: AC
  Administered 2024-06-30: 10 mg via INTRAVENOUS
  Filled 2024-06-30: qty 2

## 2024-06-30 NOTE — ED Provider Notes (Signed)
 [place] EMERGENCY DEPARTMENT AT [facility] Provider Note   CSN: [id number] Arrival date & time: 06/30/24  1426     Patient presents with: Headache   April Ford is a 58 y.o. female.  She presents ER complaining of left-sided headache x 5 days.  She denies numbness or weakness, denies sudden onset of symptoms.  The pain is intermittent, she gets some mild relief with over-the-counter Tylenol  and BC powders but states the headache comes back.  No nausea or vomiting, no sensitivity to light or noise, is not worse with any specific type of today.  It does get somewhat worse with leaning forward but she denies sinus congestion.  She denies any vision changes or pulsatile tinnitus.  Does note some tenderness to the left temporal area, but denies jaw claudication.    Headache      Prior to Admission medications   Medication Sig Start Date End Date Taking? Authorizing Provider  naproxen  (NAPROSYN ) 375 MG tablet Take 1 tablet (375 mg total) by mouth 2 (two) times daily as needed for headache. 06/30/24  Yes Valor Quaintance A, PA-C  clobetasol  ointment (TEMOVATE ) 0.05 % Apply 1 Application topically 2 (two) times daily. 02/19/24   Cook, [name], DO  hydrOXYzine  (VISTARIL ) 25 MG capsule TAKE 1 CAPSULE (25 MG TOTAL) BY MOUTH EVERY 8 (EIGHT) HOURS AS NEEDED FOR ITCHING. 03/12/24   Cook, [name], DO    Allergies: Patient has no known allergies.    Review of Systems  Neurological:  Positive for headaches.    Updated Vital Signs BP (!) 154/81 (BP Location: Right Arm)   Pulse 60   Temp 98.2 F (36.8 C) (Oral)   Resp 18   Ht 5' 7 (1.702 m)   Wt 83.9 kg   SpO2 100%   BMI 28.98 kg/m   Physical Exam Vitals and nursing note reviewed.  Constitutional:      General: She is not in acute distress.    Appearance: She is well-developed.  HENT:     Head: Normocephalic and atraumatic.  Eyes:     Conjunctiva/sclera: Conjunctivae normal.  Cardiovascular:     Rate and Rhythm: Normal  rate and regular rhythm.     Heart sounds: No murmur heard. Pulmonary:     Effort: Pulmonary effort is normal. No respiratory distress.     Breath sounds: Normal breath sounds.  Abdominal:     Palpations: Abdomen is soft.     Tenderness: There is no abdominal tenderness.  Musculoskeletal:        General: No swelling.     Cervical back: Neck supple.  Skin:    General: Skin is warm and dry.     Capillary Refill: Capillary refill takes less than 2 seconds.  Neurological:     Mental Status: She is alert and oriented to person, place, and time.     GCS: GCS eye subscore is 4. GCS verbal subscore is 5. GCS motor subscore is 6.     Cranial Nerves: Cranial nerves 2-12 are intact.     Sensory: Sensation is intact.     Motor: No weakness.     Coordination: Coordination is intact.     Gait: Gait is intact.  Psychiatric:        Mood and Affect: Mood normal.     (all labs ordered are listed, but only abnormal results are displayed) Labs Reviewed  SEDIMENTATION RATE    EKG: None  Radiology: No results found.   Procedures  Medications Ordered in the ED  ketorolac  (TORADOL ) 15 MG/ML injection 15 mg (15 mg Intravenous Given 06/30/24 1603)  diphenhydrAMINE  (BENADRYL ) injection 25 mg (25 mg Intravenous Given 06/30/24 1602)  metoCLOPramide  (REGLAN ) injection 10 mg (10 mg Intravenous Given 06/30/24 1603)  dexamethasone  (DECADRON ) injection 10 mg (10 mg Intravenous Given 06/30/24 1601)                                    Medical Decision Making This patient presents to the ED for concern of headache x 5 days, this involves an extensive number of treatment options, and is a complaint that carries with it a high risk of complications and morbidity.  The differential diagnosis includes migraine, tension headache, Intracranial hemorrhage, cluster headache, medication overuse headache, intracranial mass, temporal arteritis, other    Co morbidities that complicate the patient  evaluation  COPD   Additional history obtained:  Additional history obtained from EMR External records from outside source obtained and reviewed including prior notes and labs, CT from 07/2023   Lab Tests:  I Ordered, and personally interpreted labs.  The pertinent results include:  ESR is 13     Problem List / ED Course / Critical interventions / Medication management  Patient here with 5 days of left-sided headache described as constant, somewhat improved with OTC meds at home.  Sent by urgent care for further evaluation.  She has reassuring vital signs and normal neurologic exam.  She denies sudden onset headache, worsening of life, no meningeal symptoms.  No vomiting with this.  She did have some mild left temporal tenderness and sed rate is negative.  She has a intact headache in August 2024 and was seen in the ER at that time and had a CT scan that was normal.  She states she got a migraine cocktail, felt better, headache had return for couple of days but essentially has not had a headache since then.  This headache has been ongoing for about 5 days as well.  She is given migraine cocktail and symptoms have fully resolved.  She was also given dexamethasone  to help prevent recurrence of headache.  She is counseled to not use BC powders due to risk of GI bleeding and ulcers.  We discussed CT head, use shared decision making, as she had a CT head for the same type of headache last year, and reassuring exam, she would like to defer that at this time.  Advised to follow with her PCP.  She is given strict return precautions. I ordered medication as above Reevaluation of the patient after these medicines showed that the patient resolved I have reviewed the patients home medicines and have made adjustments as needed       Amount and/or Complexity of Data Reviewed Labs: ordered.  Risk Prescription drug management.        Final diagnoses:  Bad headache    ED Discharge Orders           Ordered    naproxen  (NAPROSYN ) 375 MG tablet  2 times daily PRN        06/30/24 1744               Suellen Sherran LABOR, PA-C 06/30/24 1810    Francesca Elsie CROME, MD 06/30/24 1934

## 2024-06-30 NOTE — Discharge Instructions (Addendum)
 You are seen today for headache.  Your exam is reassuring, we are glad you are feeling better after medications.  I have prescribed naproxen  to use if you have headache at home.  Do not take BC powders, as they have high risk of causing stomach ulcers and bleeding.  You can take over-the-counter Tylenol  as directed on packaging if needed.  If you have continued headache please follow with your PCP, you may need neurology follow-up if you are having continued recurrent headaches.  If you have worsened headache, confusion, fever or any other worrisome changes come back to the ER right away.  Make sure you are drinking plenty of water , as dehydration can make headaches worse.

## 2024-06-30 NOTE — ED Triage Notes (Signed)
 Pt with HA x 5 days.  Seen at [facility] and sent here. Pain to left forehead and down face per pt. Denies N/V but with loss of appetite

## 2024-07-03 ENCOUNTER — Encounter (HOSPITAL_COMMUNITY): Payer: Self-pay

## 2024-07-03 ENCOUNTER — Other Ambulatory Visit: Payer: Self-pay

## 2024-07-03 ENCOUNTER — Emergency Department (HOSPITAL_COMMUNITY)
Admission: EM | Admit: 2024-07-03 | Discharge: 2024-07-04 | Disposition: A | Discharge status: Home / Self Care | Attending: Student | Admitting: Student

## 2024-07-03 DIAGNOSIS — R55 Syncope and collapse: Secondary | ICD-10-CM | POA: Diagnosis present

## 2024-07-03 DIAGNOSIS — Z87891 Personal history of nicotine dependence: Secondary | ICD-10-CM | POA: Diagnosis not present

## 2024-07-03 DIAGNOSIS — R42 Dizziness and giddiness: Secondary | ICD-10-CM | POA: Insufficient documentation

## 2024-07-03 DIAGNOSIS — J449 Chronic obstructive pulmonary disease, unspecified: Secondary | ICD-10-CM | POA: Diagnosis not present

## 2024-07-03 LAB — CBC
HCT: 41.5 % (ref 36.0–46.0)
Hemoglobin: 13.4 g/dL (ref 12.0–15.0)
MCH: 29.8 pg (ref 26.0–34.0)
MCHC: 32.3 g/dL (ref 30.0–36.0)
MCV: 92.4 fL (ref 80.0–100.0)
Platelets: 221 K/uL (ref 150–400)
RBC: 4.49 MIL/uL (ref 3.87–5.11)
RDW: 13 % (ref 11.5–15.5)
WBC: 11.3 K/uL — ABNORMAL HIGH (ref 4.0–10.5)
nRBC: 0 % (ref 0.0–0.2)

## 2024-07-03 LAB — RAPID URINE DRUG SCREEN, HOSP PERFORMED
Amphetamines: NOT DETECTED
Barbiturates: NOT DETECTED
Benzodiazepines: NOT DETECTED
Cocaine: NOT DETECTED
Opiates: NOT DETECTED
Tetrahydrocannabinol: POSITIVE — AB

## 2024-07-03 LAB — URINALYSIS, ROUTINE W REFLEX MICROSCOPIC
Bacteria, UA: NONE SEEN
Bilirubin Urine: NEGATIVE
Glucose, UA: NEGATIVE mg/dL
Ketones, ur: NEGATIVE mg/dL
Leukocytes,Ua: NEGATIVE
Nitrite: NEGATIVE
Protein, ur: 30 mg/dL — AB
Specific Gravity, Urine: 1.021 (ref 1.005–1.030)
pH: 5 (ref 5.0–8.0)

## 2024-07-03 LAB — COMPREHENSIVE METABOLIC PANEL WITH GFR
ALT: 10 U/L (ref 0–44)
AST: 18 U/L (ref 15–41)
Albumin: 4.3 g/dL (ref 3.5–5.0)
Alkaline Phosphatase: 74 U/L (ref 38–126)
Anion gap: 13 (ref 5–15)
BUN: 16 mg/dL (ref 6–20)
CO2: 24 mmol/L (ref 22–32)
Calcium: 9.1 mg/dL (ref 8.9–10.3)
Chloride: 102 mmol/L (ref 98–111)
Creatinine, Ser: 1 mg/dL (ref 0.44–1.00)
GFR, Estimated: >60 mL/min (ref >60)
Glucose, Bld: 97 mg/dL (ref 70–99)
Potassium: 3.2 mmol/L — ABNORMAL LOW (ref 3.5–5.1)
Sodium: 139 mmol/L (ref 135–145)
Total Bilirubin: 1.4 mg/dL — ABNORMAL HIGH (ref 0.0–1.2)
Total Protein: 7.7 g/dL (ref 6.5–8.1)

## 2024-07-03 LAB — TROPONIN I (HIGH SENSITIVITY): Troponin I (High Sensitivity): 2 ng/L (ref <18)

## 2024-07-03 LAB — CBG MONITORING, ED: Glucose-Capillary: 110 mg/dL — ABNORMAL HIGH (ref 70–99)

## 2024-07-03 MED ORDER — LACTATED RINGERS IV BOLUS
1000.0000 mL | Freq: Once | INTRAVENOUS | Status: AC
  Administered 2024-07-03: 1000 mL via INTRAVENOUS

## 2024-07-03 NOTE — ED Triage Notes (Signed)
 Family reports they had just left [facility] and patient started having episode of sweating, eyes rolled back in her head and minimally responsive.  Reports she was complaining of being hot and panicking per daughter.  Patient is at baseline at this time.

## 2024-07-03 NOTE — ED Provider Notes (Signed)
 [place] EMERGENCY DEPARTMENT AT [facility] Provider Note  CSN: [id number] Arrival date & time: 07/03/24 1617  Chief Complaint(s) Dizziness  HPI April Ford is a 58 y.o. female who presents emergency department for evaluation of an episode of dizziness and presyncope.  Patient states that she was at the [place] [employer] and had a nice dinner.  Was in the car on the ride home when she started to get very lightheaded, had hot flashes and almost passed out.  Daughter was with her who stated that she did get diaphoretic and was confused for a short amount of time but quickly returned to normal mental status baseline.  Patient states that she has had a syncopal episode before many years ago.  Denies associated chest pain, shortness of breath, headache, fever or other systemic symptoms.  Here in the emergency department she is asymptomatic.   Past Medical History Past Medical History:  Diagnosis Date   Bronchiolitis    COPD (chronic obstructive pulmonary disease) (HCC)    Patient Active Problem List   Diagnosis Date Noted   Pruritic rash 01/02/2024   Right hip pain 12/04/2023   Vaginal discharge 12/04/2023   IBS (irritable bowel syndrome) 06/16/2023   Former smoker 02/13/2023   COPD (chronic obstructive pulmonary disease) (HCC) 09/06/2022   Mixed hyperlipidemia 09/06/2022   Preventative health care 09/06/2022   Home Medication(s) Prior to Admission medications   Medication Sig Start Date End Date Taking? Authorizing Provider  clobetasol  ointment (TEMOVATE ) 0.05 % Apply 1 Application topically 2 (two) times daily. 02/19/24   Cook, [name], DO  hydrOXYzine  (VISTARIL ) 25 MG capsule TAKE 1 CAPSULE (25 MG TOTAL) BY MOUTH EVERY 8 (EIGHT) HOURS AS NEEDED FOR ITCHING. 03/12/24   Cook, [name], DO  naproxen  (NAPROSYN ) 375 MG tablet Take 1 tablet (375 mg total) by mouth 2 (two) times daily as needed for headache. 06/30/24   Suellen Sherran LABOR, PA-C                                                                                                                                     Past Surgical History Past Surgical History:  Procedure Laterality Date   ABDOMINAL HYSTERECTOMY     COLONOSCOPY WITH PROPOFOL  N/A 01/26/2024   Procedure: COLONOSCOPY WITH PROPOFOL ;  Surgeon: Cindie Carlin POUR, DO;  [facility];  Service: Endoscopy;  Laterality: N/A;  900AM, ASA 3   Family History History reviewed. No pertinent family history.  Social History Social History   Tobacco Use   Smoking status: Former    Current packs/day: 0.00    Types: Cigarettes    Start date: 06/23/1980    Quit date: 06/23/2021    Years since quitting: 3.0   Smokeless tobacco: Never  Vaping Use   Vaping status: Some Days   Start date: 02/23/2022   Substances: Flavoring  Substance Use Topics   Alcohol use: Not Currently  Drug use: Yes    Types: Marijuana   Allergies Patient has no known allergies.  Review of Systems Review of Systems  Neurological:  Positive for dizziness, syncope and light-headedness.    Physical Exam Vital Signs  I have reviewed the triage vital signs BP (!) 143/73   Pulse (!) 49   Temp 98.2 F (36.8 C) (Oral)   Resp 15   Ht 5' 7 (1.702 m)   Wt 83.9 kg   SpO2 100%   BMI 28.98 kg/m   Physical Exam Vitals and nursing note reviewed.  Constitutional:      General: She is not in acute distress.    Appearance: She is well-developed.  HENT:     Head: Normocephalic and atraumatic.  Eyes:     Conjunctiva/sclera: Conjunctivae normal.  Cardiovascular:     Rate and Rhythm: Normal rate and regular rhythm.     Heart sounds: No murmur heard. Pulmonary:     Effort: Pulmonary effort is normal. No respiratory distress.     Breath sounds: Normal breath sounds.  Abdominal:     Palpations: Abdomen is soft.     Tenderness: There is no abdominal tenderness.  Musculoskeletal:        General: No swelling.     Cervical back: Neck supple.  Skin:    General: Skin is warm and  dry.     Capillary Refill: Capillary refill takes less than 2 seconds.  Neurological:     Mental Status: She is alert.  Psychiatric:        Mood and Affect: Mood normal.     ED Results and Treatments Labs (all labs ordered are listed, but only abnormal results are displayed) Labs Reviewed  COMPREHENSIVE METABOLIC PANEL WITH GFR - Abnormal; Notable for the following components:      Result Value   Potassium 3.2 (*)    Total Bilirubin 1.4 (*)    All other components within normal limits  CBC - Abnormal; Notable for the following components:   WBC 11.3 (*)    All other components within normal limits  URINALYSIS, ROUTINE W REFLEX MICROSCOPIC - Abnormal; Notable for the following components:   Hgb urine dipstick MODERATE (*)    Protein, ur 30 (*)    All other components within normal limits  RAPID URINE DRUG SCREEN, HOSP PERFORMED - Abnormal; Notable for the following components:   Tetrahydrocannabinol POSITIVE (*)    All other components within normal limits  CBG MONITORING, ED - Abnormal; Notable for the following components:   Glucose-Capillary 110 (*)    All other components within normal limits  TROPONIN I (HIGH SENSITIVITY)  TROPONIN I (HIGH SENSITIVITY)                                                                                                                          Radiology No results found.  Pertinent labs & imaging results that were available during my care of the patient were reviewed by  me and considered in my medical decision making (see MDM for details).  Medications Ordered in ED Medications  lactated ringers  bolus 1,000 mL (0 mLs Intravenous Stopped 07/04/24 0107)                                                                                                                                     Procedures Procedures  (including critical care time)  Medical Decision Making / ED Course   This patient presents to the ED for concern of syncope, this  involves an extensive number of treatment options, and is a complaint that carries with it a high risk of complications and morbidity.  The differential diagnosis includes orthostatic syncope, cardiogenic syncope, vasovagal syncope, electrolyte abnormality, dehydration, dysrhythmia, vasovagal, Hypoglycemia, Seizure, Autonomic Insufficiency  MDM: Patient seen emerged from for evaluation of syncope.  Physical exam is unremarkable outside of a regular bradycardia.  Laboratory ration with a leukocytosis to 11.3, hypokalemia to 3.2, UDS positive for THC but is otherwise unremarkable.  High-sensitivity troponin is normal.  ECG with sinus bradycardia.  Patient with mildly softer blood pressures of 92/62 on arrival but these were quickly improved with 1 L fluid resuscitation.  Very low suspicion for PE as she is low risk by Wells criteria and has never throughout these episodes had chest pain or shortness of breath.  Orthostatic testing on reevaluation is normal.  Presentation consistent with syncope but at this time she does not meet inpatient criteria for admission.  Will place outpatient cardiology referral.  Return precautions given which she and her daughter voiced understanding.   Additional history obtained: -Additional history obtained from daughter -External records from outside source obtained and reviewed including: Chart review including previous notes, labs, imaging, consultation notes   Lab Tests: -I ordered, reviewed, and interpreted labs.   The pertinent results include:   Labs Reviewed  COMPREHENSIVE METABOLIC PANEL WITH GFR - Abnormal; Notable for the following components:      Result Value   Potassium 3.2 (*)    Total Bilirubin 1.4 (*)    All other components within normal limits  CBC - Abnormal; Notable for the following components:   WBC 11.3 (*)    All other components within normal limits  URINALYSIS, ROUTINE W REFLEX MICROSCOPIC - Abnormal; Notable for the following components:    Hgb urine dipstick MODERATE (*)    Protein, ur 30 (*)    All other components within normal limits  RAPID URINE DRUG SCREEN, HOSP PERFORMED - Abnormal; Notable for the following components:   Tetrahydrocannabinol POSITIVE (*)    All other components within normal limits  CBG MONITORING, ED - Abnormal; Notable for the following components:   Glucose-Capillary 110 (*)    All other components within normal limits  TROPONIN I (HIGH SENSITIVITY)  TROPONIN I (HIGH SENSITIVITY)      EKG   EKG Interpretation Date/Time:  Wednesday July 03 2024 17:12:19 EDT  Ventricular Rate:  59 PR Interval:  148 QRS Duration:  82 QT Interval:  432 QTC Calculation: 427 R Axis:   69  Text Interpretation: Sinus bradycardia Nonspecific T wave abnormality Abnormal ECG When compared with ECG of 06-Oct-2022 14:57, No significant change was found Confirmed by Bralyn Espino ([id number]) on 07/03/2024 9:42:17 PM         Medicines ordered and prescription drug management: Meds ordered this encounter  Medications   lactated ringers  bolus 1,000 mL    -I have reviewed the patients home medicines and have made adjustments as needed  Critical interventions none    Cardiac Monitoring: The patient was maintained on a cardiac monitor.  I personally viewed and interpreted the cardiac monitored which showed an underlying rhythm of: Sinus bradycardia  Social Determinants of Health:  Factors impacting patients care include: Daily marijuana use, counseled cessation   Reevaluation: After the interventions noted above, I reevaluated the patient and found that they have :improved  Co morbidities that complicate the patient evaluation  Past Medical History:  Diagnosis Date   Bronchiolitis    COPD (chronic obstructive pulmonary disease) (HCC)       Dispostion: I considered admission for this patient, but at this time she does not meet inpatient criteria for admission and will be discharged with outpatient  follow-up     Final Clinical Impression(s) / ED Diagnoses Final diagnoses:  Near syncope     @PCDICTATION @    Nhi Butrum, Lum, MD 07/04/24 1708

## 2024-07-04 LAB — TROPONIN I (HIGH SENSITIVITY): Troponin I (High Sensitivity): 2 ng/L (ref <18)

## 2024-07-08 ENCOUNTER — Ambulatory Visit: Admitting: Physician Assistant

## 2024-07-08 ENCOUNTER — Encounter: Payer: Self-pay | Admitting: Physician Assistant

## 2024-07-08 VITALS — BP 132/83 | HR 66 | Temp 97.7°F | Ht 67.0 in | Wt 185.0 lb

## 2024-07-08 DIAGNOSIS — R42 Dizziness and giddiness: Secondary | ICD-10-CM | POA: Diagnosis not present

## 2024-07-08 DIAGNOSIS — E876 Hypokalemia: Secondary | ICD-10-CM

## 2024-07-08 NOTE — Assessment & Plan Note (Signed)
 Resolved. Reassuring workup in the ER. Repeating BMP today due to hypokalemia. Normal heart and lung sounds today.

## 2024-07-08 NOTE — Progress Notes (Signed)
   Established Patient Office Visit  Subjective   Patient ID: April Ford, female    DOB: Dec 14, 1966  Age: 58 y.o. MRN: [id number]  Chief Complaint  Patient presents with   Follow-up    Dizziness    Patient presents today for ER follow up regarding an episode of dizziness on 7/9. At that time, patient relates episode of dizziness while riding home from dinner. Endorsing lightheadedness and feeling hot. Overall, negative workup in the ER. CMP significant for mild hypokalemia. Today, she denies symptoms or additional episodes of dizziness. She denies concerns or complaints today.   Review of Systems  Constitutional:  Negative for fever, malaise/fatigue and weight loss.  Respiratory:  Negative for shortness of breath.   Cardiovascular:  Negative for chest pain and palpitations.  Musculoskeletal:  Negative for falls.  Neurological:  Negative for dizziness, seizures, loss of consciousness and headaches.      Objective:     BP 132/83   Pulse 66   Temp 97.7 F (36.5 C)   Ht 5' 7 (1.702 m)   Wt 185 lb (83.9 kg)   SpO2 100%   BMI 28.98 kg/m    Physical Exam Constitutional:      Appearance: Normal appearance.  HENT:     Head: Normocephalic.     Mouth/Throat:     Mouth: Mucous membranes are moist.     Pharynx: Oropharynx is clear.  Eyes:     Extraocular Movements: Extraocular movements intact.     Conjunctiva/sclera: Conjunctivae normal.  Cardiovascular:     Rate and Rhythm: Normal rate and regular rhythm.     Heart sounds: Normal heart sounds. No murmur heard. Pulmonary:     Effort: Pulmonary effort is normal.     Breath sounds: Normal breath sounds. No wheezing or rales.  Skin:    General: Skin is warm and dry.  Neurological:     General: No focal deficit present.     Mental Status: She is alert and oriented to person, place, and time.  Psychiatric:        Mood and Affect: Mood normal.        Behavior: Behavior normal.     No results found for any visits on  07/08/24.  The 10-year ASCVD risk score (Arnett DK, et al., 2019) is: 4.8%    Assessment & Plan:   Return if symptoms worsen or fail to improve.   Dizziness Assessment & Plan: Resolved. Reassuring workup in the ER. Repeating BMP today due to hypokalemia. Normal heart and lung sounds today.    Hypokalemia -     Basic metabolic panel with GFR    Charmaine Tenleigh Byer, PA-C

## 2024-07-09 ENCOUNTER — Ambulatory Visit: Payer: Self-pay | Admitting: Physician Assistant

## 2024-07-09 LAB — BASIC METABOLIC PANEL WITH GFR
BUN/Creatinine Ratio: 11 (ref 9–23)
BUN: 11 mg/dL (ref 6–24)
CO2: 22 mmol/L (ref 20–29)
Calcium: 9.4 mg/dL (ref 8.7–10.2)
Chloride: 105 mmol/L (ref 96–106)
Creatinine, Ser: 1.01 mg/dL — ABNORMAL HIGH (ref 0.57–1.00)
Glucose: 60 mg/dL — ABNORMAL LOW (ref 70–99)
Potassium: 4.3 mmol/L (ref 3.5–5.2)
Sodium: 143 mmol/L (ref 134–144)
eGFR: 65 mL/min/1.73 (ref >59)

## 2024-07-25 ENCOUNTER — Ambulatory Visit: Payer: No Typology Code available for payment source | Admitting: Dermatology

## 2024-10-03 NOTE — Progress Notes (Deleted)
 CARDIOLOGY CONSULT NOTE       Patient ID: April Ford MRN: [id number] DOB/AGE: 09/22/1966 58 y.o.  Referring Physician: Charmaine Pontiff PA  Primary Physician: Cook, [name], DO Primary Cardiologist: New Reason for Consultation: Near Syncope    HPI:  58 y.o. referred by [name] for near syncope. Seen in ED 07/03/24 Episode of dizziness riding home from dinner. Felt warm and flushed. Daughter was with her who stated that she did get diaphoretic and was confused for a short amount of time but quickly returned to normal mental status baseline No chest pain, palpitations or dyspnea. W/U in ER benign other than K 3.2 which was normalized on f/ labs 7/14. R/O UDS positive for THC. WBC mildly elevated 11.3 Troponin negative ECG SR rate 59 nonspecific ST changes normal QT. Not orthostatic in ER. Monitor in ER NSR no arrhythmias Has diagnosis of bronchiolitis and COPD Smokes marijuana daily  ***  ROS All other systems reviewed and negative except as noted above  Past Medical History:  Diagnosis Date   Bronchiolitis    COPD (chronic obstructive pulmonary disease) (HCC)     No family history on file.  Social History   Socioeconomic History   Marital status: Single    Spouse name: Not on file   Number of children: Not on file   Years of education: Not on file   Highest education level: Not on file  Occupational History   Not on file  Tobacco Use   Smoking status: Former    Current packs/day: 0.00    Types: Cigarettes    Start date: 06/23/1980    Quit date: 06/23/2021    Years since quitting: 3.2   Smokeless tobacco: Never  Vaping Use   Vaping status: Some Days   Start date: 02/23/2022   Substances: Flavoring  Substance and Sexual Activity   Alcohol use: Not Currently   Drug use: Yes    Types: Marijuana   Sexual activity: Not Currently    Birth control/protection: None  Other Topics Concern   Not on file  Social History Narrative   Not on file   Social Drivers of  Health   Financial Resource Strain: Not on file  Food Insecurity: Not on file  Transportation Needs: Not on file  Physical Activity: Not on file  Stress: Not on file  Social Connections: Not on file  Intimate Partner Violence: Not on file    Past Surgical History:  Procedure Laterality Date   ABDOMINAL HYSTERECTOMY     COLONOSCOPY WITH PROPOFOL  N/A 01/26/2024   Procedure: COLONOSCOPY WITH PROPOFOL ;  Surgeon: Cindie Carlin POUR, DO;  Location: [facility];  Service: Endoscopy;  Laterality: N/A;  900AM, ASA 3     No current outpatient medications on file.    Physical Exam: There were no vitals taken for this visit.   Affect appropriate Healthy:  appears stated age HEENT: normal Neck supple with no adenopathy JVP normal no bruits no thyromegaly Lungs clear with no wheezing and good diaphragmatic motion Heart:  S1/S2 no murmur, no rub, gallop or click PMI normal Abdomen: benighn, BS positve, no tenderness, no AAA no bruit.  No HSM or HJR Distal pulses intact with no bruits No edema Neuro non-focal Skin warm and dry No muscular weakness   Labs:   Lab Results  Component Value Date   WBC 11.3 (H) 07/03/2024   HGB 13.4 07/03/2024   HCT 41.5 07/03/2024   MCV 92.4 07/03/2024   PLT 221 07/03/2024  No results for input(s): NA, K, CL, CO2, BUN, CREATININE, CALCIUM, PROT, BILITOT, ALKPHOS, ALT, AST, GLUCOSE in the last 168 hours.  Invalid input(s): LABALBU No results found for: CKTOTAL, CKMB, CKMBINDEX, TROPONINI  Lab Results  Component Value Date   CHOL 184 09/05/2022   Lab Results  Component Value Date   HDL 48 09/05/2022   Lab Results  Component Value Date   LDLCALC 103 (H) 09/05/2022   Lab Results  Component Value Date   TRIG 192 (H) 09/05/2022   Lab Results  Component Value Date   CHOLHDL 3.8 09/05/2022   No results found for: LDLDIRECT    Radiology: No results found.  EKG: See HPO   ASSESSMENT AND PLAN:    Dizziness:  post prandial with daily THC use. Benign w/u in ER. Will order echo to r/o structural heart dx.  No focal deficits ECG normal intervals and QT. No chest pain, dyspnea or palpitations Screen for CAD with calcium score THC:  cautioned on use and lacing additives to source  COPD/Bronchiolitis:  Did not have CXR in ER will order  CXR Echo Calcium score  F/U PRN pending tests   Signed: [name] 10/03/2024, 1:36 PM

## 2024-10-11 ENCOUNTER — Ambulatory Visit: Admitting: Cardiovascular Disease

## 2024-11-26 ENCOUNTER — Emergency Department (HOSPITAL_COMMUNITY)

## 2024-11-26 ENCOUNTER — Emergency Department (HOSPITAL_COMMUNITY)
Admission: EM | Admit: 2024-11-26 | Discharge: 2024-11-27 | Disposition: A | Source: Ambulatory Visit | Attending: Emergency Medicine | Admitting: Emergency Medicine

## 2024-11-26 ENCOUNTER — Other Ambulatory Visit: Payer: Self-pay

## 2024-11-26 ENCOUNTER — Ambulatory Visit: Payer: Self-pay

## 2024-11-26 DIAGNOSIS — R0789 Other chest pain: Secondary | ICD-10-CM | POA: Insufficient documentation

## 2024-11-26 DIAGNOSIS — R0602 Shortness of breath: Secondary | ICD-10-CM | POA: Insufficient documentation

## 2024-11-26 DIAGNOSIS — Z7982 Long term (current) use of aspirin: Secondary | ICD-10-CM | POA: Insufficient documentation

## 2024-11-26 DIAGNOSIS — R079 Chest pain, unspecified: Secondary | ICD-10-CM

## 2024-11-26 DIAGNOSIS — J449 Chronic obstructive pulmonary disease, unspecified: Secondary | ICD-10-CM | POA: Insufficient documentation

## 2024-11-26 LAB — HEPATIC FUNCTION PANEL
ALT: 6 U/L (ref 0–44)
AST: 24 U/L (ref 15–41)
Albumin: 5.1 g/dL — ABNORMAL HIGH (ref 3.5–5.0)
Alkaline Phosphatase: 92 U/L (ref 38–126)
Bilirubin, Direct: 0.4 mg/dL — ABNORMAL HIGH (ref 0.0–0.2)
Indirect Bilirubin: 0.7 mg/dL (ref 0.3–0.9)
Total Bilirubin: 1.1 mg/dL (ref 0.0–1.2)
Total Protein: 8.2 g/dL — ABNORMAL HIGH (ref 6.5–8.1)

## 2024-11-26 LAB — TROPONIN T, HIGH SENSITIVITY
Troponin T High Sensitivity: <15 ng/L (ref 0–19)
Troponin T High Sensitivity: <15 ng/L (ref 0–19)

## 2024-11-26 LAB — BASIC METABOLIC PANEL WITH GFR
Anion gap: 18 — ABNORMAL HIGH (ref 5–15)
BUN: 11 mg/dL (ref 6–20)
CO2: 19 mmol/L — ABNORMAL LOW (ref 22–32)
Calcium: 10 mg/dL (ref 8.9–10.3)
Chloride: 104 mmol/L (ref 98–111)
Creatinine, Ser: 0.91 mg/dL (ref 0.44–1.00)
GFR, Estimated: >60 mL/min (ref >60)
Glucose, Bld: 104 mg/dL — ABNORMAL HIGH (ref 70–99)
Potassium: 3.7 mmol/L (ref 3.5–5.1)
Sodium: 142 mmol/L (ref 135–145)

## 2024-11-26 LAB — LIPASE, BLOOD: Lipase: 14 U/L (ref 11–51)

## 2024-11-26 LAB — CBC WITH DIFFERENTIAL/PLATELET
Abs Immature Granulocytes: 0.02 K/uL (ref 0.00–0.07)
Basophils Absolute: 0 K/uL (ref 0.0–0.1)
Basophils Relative: 0 %
Eosinophils Absolute: 0.1 K/uL (ref 0.0–0.5)
Eosinophils Relative: 1 %
HCT: 40.3 % (ref 36.0–46.0)
Hemoglobin: 13.6 g/dL (ref 12.0–15.0)
Immature Granulocytes: 0 %
Lymphocytes Relative: 28 %
Lymphs Abs: 3.1 K/uL (ref 0.7–4.0)
MCH: 30.1 pg (ref 26.0–34.0)
MCHC: 33.7 g/dL (ref 30.0–36.0)
MCV: 89.2 fL (ref 80.0–100.0)
Monocytes Absolute: 0.5 K/uL (ref 0.1–1.0)
Monocytes Relative: 5 %
Neutro Abs: 7.4 K/uL (ref 1.7–7.7)
Neutrophils Relative %: 66 %
Platelets: 251 K/uL (ref 150–400)
RBC: 4.52 MIL/uL (ref 3.87–5.11)
RDW: 12.7 % (ref 11.5–15.5)
WBC: 11.1 K/uL — ABNORMAL HIGH (ref 4.0–10.5)
nRBC: 0 % (ref 0.0–0.2)

## 2024-11-26 LAB — PRO BRAIN NATRIURETIC PEPTIDE: Pro Brain Natriuretic Peptide: <50 pg/mL (ref <300.0)

## 2024-11-26 LAB — CBG MONITORING, ED: Glucose-Capillary: 128 mg/dL — ABNORMAL HIGH (ref 70–99)

## 2024-11-26 LAB — MAGNESIUM: Magnesium: 2.2 mg/dL (ref 1.7–2.4)

## 2024-11-26 MED ORDER — ALUM & MAG HYDROXIDE-SIMETH 200-200-20 MG/5ML PO SUSP
30.0000 mL | Freq: Once | ORAL | Status: AC
  Administered 2024-11-26: 30 mL via ORAL
  Filled 2024-11-26: qty 30

## 2024-11-26 MED ORDER — HYDROMORPHONE HCL 1 MG/ML IJ SOLN
0.5000 mg | Freq: Once | INTRAMUSCULAR | Status: AC
  Administered 2024-11-26: 0.5 mg via INTRAVENOUS
  Filled 2024-11-26: qty 0.5

## 2024-11-26 MED ORDER — IOHEXOL 350 MG/ML SOLN
75.0000 mL | Freq: Once | INTRAVENOUS | Status: AC | PRN
  Administered 2024-11-26: 75 mL via INTRAVENOUS

## 2024-11-26 MED ORDER — NITROGLYCERIN 2 % TD OINT
1.0000 [in_us] | TOPICAL_OINTMENT | Freq: Once | TRANSDERMAL | Status: AC
  Administered 2024-11-26: 1 [in_us] via TOPICAL
  Filled 2024-11-26: qty 1

## 2024-11-26 MED ORDER — LIDOCAINE VISCOUS HCL 2 % MT SOLN
15.0000 mL | Freq: Once | OROMUCOSAL | Status: AC
  Administered 2024-11-26: 15 mL via ORAL
  Filled 2024-11-26: qty 15

## 2024-11-26 MED ORDER — FAMOTIDINE IN NACL 20-0.9 MG/50ML-% IV SOLN
20.0000 mg | Freq: Once | INTRAVENOUS | Status: AC
  Administered 2024-11-26: 20 mg via INTRAVENOUS
  Filled 2024-11-26: qty 50

## 2024-11-26 NOTE — Telephone Encounter (Signed)
 FYI Only or Action Required?: FYI only for provider: ED advised. Pt refused ED/911 call; Would like to schedule appt for rash/knot.   Patient was last seen in primary care on 07/08/2024 by [place], [place], [place].  Called Nurse Triage reporting Rash and Mass.  Symptoms began today.  Interventions attempted: Nothing.  Symptoms are: unchanged.  Triage Disposition: Call EMS 911 Now  Patient/caregiver understands and will follow disposition?: No, refuses disposition Reason for Disposition  [1] Chest pain lasts > 5 minutes AND [2] age > 74  Answer Assessment - Initial Assessment Questions Pt calls in stating she has a knot near her collar bone that she was previously seen for, but believes it may be a little bit larger. She also mentions that she's been experiencing a rash to both arms from neck down that comes and goes and she is unsure what may be causing it. She then mentions that she started to feel chest pain that she describes as tightness that started about 30 minutes while active at work; She states she has mild SOB that was worse but got better after sitting down and resting, but she believes this to be her baseline r/t COPD; Patient also endorses Nausea during time of call, but states she believes it is d/t not eating; Strongly advised patient to call 911, but refused stating she will call them if symptoms worsen; Would like to schedule an appt r/t rash/knot.   1. LOCATION: Where does it hurt?       Chest  2. RADIATION: Does the pain go anywhere else? (e.g., into neck, jaw, arms, back)     No  3. ONSET: When did the chest pain begin? (Minutes, hours or days)      30 minutes ago  4. PATTERN: Does the pain come and go, or has it been constant since it started?  Does it get worse with exertion?      Constant since it started   5. DURATION: How long does it last (e.g., seconds, minutes, hours)     Constant  6. SEVERITY: How bad is the pain?  (e.g., Scale 1-10; mild,  moderate, or severe)     States it's more of a tight feeling  7. CARDIAC RISK FACTORS: Do you have any history of heart problems or risk factors for heart disease? (e.g., angina, prior heart attack; diabetes, high blood pressure, high cholesterol, smoker, or strong family history of heart disease)     Family history-mother recently passed from heart attack  8. PULMONARY RISK FACTORS: Do you have any history of lung disease?  (e.g., blood clots in lung, asthma, emphysema, birth control pills)     COPD  9. CAUSE: What do you think is causing the chest pain?     I don't know, it's just this tightness like someone's opening a fist in there.   10. OTHER SYMPTOMS: Do you have any other symptoms? (e.g., dizziness, nausea, vomiting, sweating, fever, difficulty breathing, cough)       Nausea; states she has mild SOB but believes it is her baseline r/t COPD; Denies any other symptoms   11. PREGNANCY: Is there any chance you are pregnant? When was your last menstrual period?       NA  Protocols used: Chest Pain-A-AH  Copied from CRM #[id number]. Topic: Clinical - Red Word Triage >> Nov 26, 2024  4:45 PM China J wrote: [facility] that prompted transfer to Nurse Triage: The patient has a knot by her collar bone that is  getting bigger in size and she is also getting breakouts that look like red spots and rashes that are bumpy and come and go. She is having having chest pain.

## 2024-11-26 NOTE — ED Provider Notes (Signed)
  Provider Note MRN:  [id number]  Arrival date & time: 11/27/24    ED Course and Medical Decision Making  Assumed care of patient at sign-out or upon transfer.  Chest pain worsening throughout the day awaiting CT PE study.  12 AM update: Patient feeling better after GI cocktail, workup is reassuring with normal CTA, troponin negative x 2.  Doubt cardiac cause, discharged home with return precautions.  Procedures  Final Clinical Impressions(s) / ED Diagnoses     ICD-10-CM   1. Chest pain, unspecified type  R07.9       ED Discharge Orders          Ordered    omeprazole (PRILOSEC) 20 MG capsule  Daily        11/27/24 0013    sucralfate (CARAFATE) 1 g tablet  4 times daily PRN        11/27/24 0013              Discharge Instructions      You were evaluated in the Emergency Department and after careful evaluation, we did not find any emergent condition requiring admission or further testing in the hospital.  Your exam/testing today is overall reassuring.  Symptoms may be related to acid reflux.  Recommend use of the omeprazole daily to prevent pain, use the Carafate as needed during the day for more immediate relief.  Follow-up with your primary care doctor to discuss your ER visit and your symptoms.  Please return to the Emergency Department if you experience any worsening of your condition.   Thank you for allowing us  to be a part of your care.      Ozell HERO. Theadore, MD [facility] Emergency Medicine [facility] [email or web address]    Theadore Ozell HERO, MD 11/27/24 [phone]

## 2024-11-26 NOTE — ED Triage Notes (Signed)
 Midsternal chest pain started this afternoon. Denies n/v. SOB with hx of COPD. PCP advised to come to ER

## 2024-11-26 NOTE — ED Provider Notes (Signed)
 [place] EMERGENCY DEPARTMENT AT [facility] Provider Note   CSN: [id number] Arrival date & time: 11/26/24  1933     Patient presents with: Chest Pain   April Ford is a 58 y.o. female.   HPI Patient presents for chest pain.  Medical history includes COPD, HLD, IBS.  She is not followed by cardiology.  This morning, she was in her normal state of health.  Around midday, she had a mild tightness and pressure sensation in her chest.  She went to work at 4 PM.  While at work, she was doing mild physical activity.  She had worsening of chest discomfort.  She became diaphoretic and short of breath.  Symptoms have been persistent since that time.  Current chest discomfort is rated 10/10 in severity.  She denies any associated nausea.  She denies any pleurisy.    Prior to Admission medications   Medication Sig Start Date End Date Taking? Authorizing Provider  aspirin EC 325 MG tablet Take 325 mg by mouth every 6 (six) hours as needed (chest pain).   Yes [provider]    Allergies: Patient has no known allergies.    Review of Systems  Constitutional:  Positive for diaphoresis.  Respiratory:  Positive for shortness of breath.   Cardiovascular:  Positive for chest pain.  All other systems reviewed and are negative.   Updated Vital Signs BP (!) 140/103   Pulse (!) 54   Resp 18   Wt 83.9 kg   SpO2 100%   BMI 28.97 kg/m   Physical Exam Vitals and nursing note reviewed.  Constitutional:      General: She is not in acute distress.    Appearance: She is well-developed. She is ill-appearing. She is not toxic-appearing or diaphoretic.  HENT:     Head: Normocephalic and atraumatic.  Eyes:     Conjunctiva/sclera: Conjunctivae normal.  Cardiovascular:     Rate and Rhythm: Normal rate and regular rhythm.     Heart sounds: No murmur heard. Pulmonary:     Effort: Pulmonary effort is normal. Tachypnea present. No respiratory distress.     Breath sounds: Normal  breath sounds. No decreased breath sounds, wheezing, rhonchi or rales.  Chest:     Chest wall: No tenderness.  Abdominal:     Palpations: Abdomen is soft.     Tenderness: There is no abdominal tenderness.  Musculoskeletal:        General: No swelling. Normal range of motion.     Cervical back: Normal range of motion and neck supple.  Skin:    General: Skin is warm and dry.     Coloration: Skin is not cyanotic or pale.  Neurological:     General: No focal deficit present.     Mental Status: She is alert and oriented to person, place, and time.  Psychiatric:        Mood and Affect: Mood normal.        Behavior: Behavior normal.     (all labs ordered are listed, but only abnormal results are displayed) Labs Reviewed  BASIC METABOLIC PANEL WITH GFR - Abnormal; Notable for the following components:      Result Value   CO2 19 (*)    Glucose, Bld 104 (*)    Anion gap 18 (*)    All other components within normal limits  HEPATIC FUNCTION PANEL - Abnormal; Notable for the following components:   Total Protein 8.2 (*)    Albumin 5.1 (*)  Bilirubin, Direct 0.4 (*)    All other components within normal limits  CBC WITH DIFFERENTIAL/PLATELET - Abnormal; Notable for the following components:   WBC 11.1 (*)    All other components within normal limits  CBG MONITORING, ED - Abnormal; Notable for the following components:   Glucose-Capillary 128 (*)    All other components within normal limits  LIPASE, BLOOD  MAGNESIUM  PRO BRAIN NATRIURETIC PEPTIDE  TROPONIN T, HIGH SENSITIVITY  TROPONIN T, HIGH SENSITIVITY    EKG: EKG Interpretation Date/Time:  Tuesday November 26 2024 19:48:00 EST Ventricular Rate:  66 PR Interval:  149 QRS Duration:  85 QT Interval:  424 QTC Calculation: 445 R Axis:   60  Text Interpretation: Sinus rhythm Probable left atrial enlargement Confirmed by Melvenia Motto [phone]) on 11/26/2024 7:51:40 PM  Radiology: [facility] Chest 2 View Result Date: 11/26/2024 EXAM: 2  VIEW(S) XRAY OF THE CHEST 11/26/2024 08:16:00 PM COMPARISON: None available. CLINICAL HISTORY: chest pain FINDINGS: LUNGS AND PLEURA: No focal pulmonary opacity. No pleural effusion. No pneumothorax. HEART AND MEDIASTINUM: No acute abnormality of the cardiac and mediastinal silhouettes. BONES AND SOFT TISSUES: No acute osseous abnormality. IMPRESSION: 1. No acute cardiopulmonary process. Electronically signed by: Norman Gatlin MD 11/26/2024 08:22 PM EST RP Workstation: [id number]     Procedures   Medications Ordered in the ED  alum & mag hydroxide-simeth (MAALOX/MYLANTA) 200-200-20 MG/5ML suspension 30 mL (has no administration in time range)    And  lidocaine (XYLOCAINE) 2 % viscous mouth solution 15 mL (has no administration in time range)  famotidine (PEPCID) IVPB 20 mg premix (has no administration in time range)  nitroGLYCERIN (NITROGLYN) 2 % ointment 1 inch (1 inch Topical Given 11/26/24 2000)  HYDROmorphone (DILAUDID) injection 0.5 mg (0.5 mg Intravenous Given 11/26/24 2001)  HYDROmorphone (DILAUDID) injection 0.5 mg (0.5 mg Intravenous Given 11/26/24 2059)  iohexol  (OMNIPAQUE ) 350 MG/ML injection 75 mL (75 mLs Intravenous Contrast Given 11/26/24 2118)                                    Medical Decision Making Amount and/or Complexity of Data Reviewed Labs: ordered. Radiology: ordered.  Risk Prescription drug management.   This patient presents to the ED for concern of chest pain, this involves an extensive number of treatment options, and is a complaint that carries with it a high risk of complications and morbidity.  The differential diagnosis includes CS, PUD, GERD, pericarditis, PE, musculoskeletal etiology   Co morbidities / Chronic conditions that complicate the patient evaluation  COPD, HLD, IBS   Additional history obtained:  Additional history obtained from EMR External records from outside source obtained and reviewed including N/A   Lab Tests:  I Ordered,  and personally interpreted labs.  The pertinent results include: Hemoglobin: Mildly cytosis, normal kidney function, normal electrolytes, normal troponin, normal BNP   Imaging Studies ordered:  I ordered imaging studies including chest x-ray, CTA chest I independently visualized and interpreted imaging which showed no acute findings on x-ray, CTA pending at time of signout. I agree with the radiologist interpretation   Cardiac Monitoring: / EKG:  The patient was maintained on a cardiac monitor.  I personally viewed and interpreted the cardiac monitored which showed an underlying rhythm of: Sinus rhythm   Problem List / ED Course / Critical interventions / Medication management  Patient presenting for chest pain and shortness of breath.  Onset was today.  On arrival in the ED, patient is tachypneic and appears uncomfortable.  She endorses a central chest pain and pressure.  She did take 324 of ASA prior to arrival.  She is a former smoker.  Other medical history notable for HLD and COPD.  No wheezing appreciated on lung auscultation.  She does not have pleurisy.  EKG does not show evidence of STEMI.  Dilaudid was ordered for analgesia.  On reassessment, pain is improved but still present.  Additional Dilaudid ordered.  Lab work is reassuring with normal troponins x 2.  Patient endorses continued improved, but still present pain.  CTA of chest was ordered.  Will trial GI cocktail.  Care of patient signed out to oncoming ED provider. I ordered medication including Dilaudid and NTG for chest pain; GI cocktail for possible reflux cause of symptoms Reevaluation of the patient after these medicines showed that the patient improved I have reviewed the patients home medicines and have made adjustments as needed  Social Determinants of Health:  Lives independently     Final diagnoses:  Chest pain, unspecified type    ED Discharge Orders     None          Melvenia Motto, MD 11/26/24  2300

## 2024-11-27 MED ORDER — SUCRALFATE 1 G PO TABS: 1.0000 g | ORAL_TABLET | Freq: Four times a day (QID) | ORAL | 0 refills | Status: DC | PRN

## 2024-11-27 MED ORDER — OMEPRAZOLE 20 MG PO CPDR: 20.0000 mg | DELAYED_RELEASE_CAPSULE | Freq: Every day | ORAL | 1 refills | Status: DC

## 2024-11-27 NOTE — Discharge Instructions (Signed)
 You were evaluated in the Emergency Department and after careful evaluation, we did not find any emergent condition requiring admission or further testing in the hospital.  Your exam/testing today is overall reassuring.  Symptoms may be related to acid reflux.  Recommend use of the omeprazole daily to prevent pain, use the Carafate as needed during the day for more immediate relief.  Follow-up with your primary care doctor to discuss your ER visit and your symptoms.  Please return to the Emergency Department if you experience any worsening of your condition.   Thank you for allowing us  to be a part of your care.

## 2024-12-10 ENCOUNTER — Ambulatory Visit: Admitting: Family Medicine

## 2024-12-10 ENCOUNTER — Encounter: Payer: Self-pay | Admitting: Family Medicine

## 2024-12-10 VITALS — BP 136/85 | HR 60 | Temp 96.4°F | Ht 67.0 in | Wt 182.0 lb

## 2024-12-10 DIAGNOSIS — R21 Rash and other nonspecific skin eruption: Secondary | ICD-10-CM | POA: Diagnosis not present

## 2024-12-10 MED ORDER — CLOBETASOL PROPIONATE 0.05 % EX OINT: 1.0000 | TOPICAL_OINTMENT | Freq: Two times a day (BID) | CUTANEOUS | 0 refills | Status: AC

## 2024-12-10 MED ORDER — HYDROXYZINE PAMOATE 25 MG PO CAPS: 25.0000 mg | ORAL_CAPSULE | Freq: Three times a day (TID) | ORAL | 0 refills | Status: AC | PRN

## 2024-12-10 NOTE — Assessment & Plan Note (Addendum)
 Reoccurring.  Referring to dermatology.  Will likely need biopsy.  Empiric clobetasol .  Hydroxyzine  for itching.

## 2024-12-10 NOTE — Patient Instructions (Signed)
 Medications as prescribed.  Referral placed.

## 2024-12-10 NOTE — Progress Notes (Signed)
 Subjective:  Patient ID: April Ford, female    DOB: Oct 26, 1966  Age: 58 y.o. MRN: [id number]  CC:   Chief Complaint  Patient presents with   re-ocurring rash on both arms    Last occurred on 10/2023- did not attend derm appt due to it was clear by appt time has been itching     HPI:  58 year old female presents with recurring rash.  Patient has a history of rash particular to the arms.  Previously has been the vesicular rash and responded to corticosteroids.  She reports recent recurrence.  She states that she had a referral to dermatology but did not go as her rash was resolved by the time her appointment came around.  She reports that she currently has a raised erythematous pruritic rash to the lower arms bilaterally.  No relief with Benadryl  cream.  She states that she believes that it may be caused by this detergent at work.  She can think of no other inciting factors.  Patient Active Problem List   Diagnosis Date Noted   Rash 12/10/2024   Dizziness 07/08/2024   IBS (irritable bowel syndrome) 06/16/2023   Former smoker 02/13/2023   COPD (chronic obstructive pulmonary disease) (HCC) 09/06/2022   Mixed hyperlipidemia 09/06/2022   Preventative health care 09/06/2022    Social Hx   Social History   Socioeconomic History   Marital status: Single    Spouse name: Not on file   Number of children: Not on file   Years of education: Not on file   Highest education level: Not on file  Occupational History   Not on file  Tobacco Use   Smoking status: Former    Current packs/day: 0.00    Types: Cigarettes    Start date: 06/23/1980    Quit date: 06/23/2021    Years since quitting: 3.4   Smokeless tobacco: Never  Vaping Use   Vaping status: Some Days   Start date: 02/23/2022   Substances: Flavoring  Substance and Sexual Activity   Alcohol use: Not Currently   Drug use: Yes    Types: Marijuana   Sexual activity: Not Currently    Birth control/protection: None  Other Topics  Concern   Not on file  Social History Narrative   Not on file   Social Drivers of Health   Tobacco Use: Medium Risk (12/10/2024)   Patient History    Smoking Tobacco Use: Former    Smokeless Tobacco Use: Never    Passive Exposure: Not on Actuary Strain: Not on file  Food Insecurity: Not on file  Transportation Needs: Not on file  Physical Activity: Not on file  Stress: Not on file  Social Connections: Not on file  Depression (PHQ2-9): High Risk (12/10/2024)   Depression (PHQ2-9)    PHQ-2 Score: 14  Alcohol Screen: Not on file  Housing: Not on file  Utilities: Not on file  Health Literacy: Not on file    Review of Systems Per HPI  Objective:  BP 136/85   Pulse 60   Temp (!) 96.4 F (35.8 C)   Ht 5' 7 (1.702 m)   Wt 182 lb (82.6 kg)   SpO2 100%   BMI 28.51 kg/m      12/10/2024   10:46 AM 11/26/2024    9:15 PM 11/26/2024    8:15 PM  BP/Weight  Systolic BP 136 124 140  Diastolic BP 85 86 103  Wt. (Lbs) 182    BMI 28.51 kg/m2  Physical Exam Vitals and nursing note reviewed.  Constitutional:      Appearance: Normal appearance.  HENT:     Head: Normocephalic and atraumatic.  Pulmonary:     Effort: Pulmonary effort is normal. No respiratory distress.  Skin:    Comments: Rash is predominant located on the forearms.  Raised, erythematous papules with eschar.  Neurological:     Mental Status: She is alert.     Lab Results  Component Value Date   WBC 11.1 (H) 11/26/2024   HGB 13.6 11/26/2024   HCT 40.3 11/26/2024   PLT 251 11/26/2024   GLUCOSE 104 (H) 11/26/2024   CHOL 184 09/05/2022   TRIG 192 (H) 09/05/2022   HDL 48 09/05/2022   LDLCALC 103 (H) 09/05/2022   ALT 6 11/26/2024   AST 24 11/26/2024   NA 142 11/26/2024   K 3.7 11/26/2024   CL 104 11/26/2024   CREATININE 0.91 11/26/2024   BUN 11 11/26/2024   CO2 19 (L) 11/26/2024     Assessment & Plan:  Rash Assessment & Plan: Reoccurring.  Referring to dermatology.  Will  likely need biopsy.  Empiric clobetasol .  Hydroxyzine  for itching.  Orders: -     hydrOXYzine  Pamoate; Take 1 capsule (25 mg total) by mouth every 8 (eight) hours as needed for itching.  Dispense: 30 capsule; Refill: 0 -     Clobetasol  Propionate; Apply 1 Application topically 2 (two) times daily.  Dispense: 30 g; Refill: 0 -     Ambulatory referral to Dermatology    Follow-up:  Return if symptoms worsen or fail to improve.  Jacqulyn Ahle DO [facility]

## 2025-01-16 ENCOUNTER — Emergency Department (HOSPITAL_COMMUNITY)

## 2025-01-16 ENCOUNTER — Other Ambulatory Visit: Payer: Self-pay

## 2025-01-16 ENCOUNTER — Emergency Department (HOSPITAL_COMMUNITY)
Admission: EM | Admit: 2025-01-16 | Discharge: 2025-01-17 | Discharge status: Against Medical Advice | Attending: Emergency Medicine | Admitting: Emergency Medicine

## 2025-01-16 DIAGNOSIS — Z5321 Procedure and treatment not carried out due to patient leaving prior to being seen by health care provider: Secondary | ICD-10-CM | POA: Insufficient documentation

## 2025-01-16 DIAGNOSIS — R0789 Other chest pain: Secondary | ICD-10-CM | POA: Diagnosis present

## 2025-01-16 DIAGNOSIS — R0602 Shortness of breath: Secondary | ICD-10-CM | POA: Insufficient documentation

## 2025-01-16 LAB — CBC
HCT: 39.3 % (ref 36.0–46.0)
Hemoglobin: 13 g/dL (ref 12.0–15.0)
MCH: 30 pg (ref 26.0–34.0)
MCHC: 33.1 g/dL (ref 30.0–36.0)
MCV: 90.6 fL (ref 80.0–100.0)
Platelets: 243 K/uL (ref 150–400)
RBC: 4.34 MIL/uL (ref 3.87–5.11)
RDW: 12.8 % (ref 11.5–15.5)
WBC: 10.6 K/uL — ABNORMAL HIGH (ref 4.0–10.5)
nRBC: 0 % (ref 0.0–0.2)

## 2025-01-16 NOTE — ED Triage Notes (Signed)
 Pt c/o  central chest pressure with SOB that woke her up at 0400 that has gotten progressively worse throughout the day.

## 2025-01-17 ENCOUNTER — Encounter (HOSPITAL_COMMUNITY): Payer: Self-pay

## 2025-01-17 ENCOUNTER — Observation Stay (HOSPITAL_COMMUNITY)
Admission: EM | Admit: 2025-01-17 | Discharge: 2025-01-18 | Disposition: A | Attending: Internal Medicine | Admitting: Internal Medicine

## 2025-01-17 DIAGNOSIS — Z7982 Long term (current) use of aspirin: Secondary | ICD-10-CM | POA: Insufficient documentation

## 2025-01-17 DIAGNOSIS — K219 Gastro-esophageal reflux disease without esophagitis: Secondary | ICD-10-CM | POA: Diagnosis not present

## 2025-01-17 DIAGNOSIS — F1290 Cannabis use, unspecified, uncomplicated: Secondary | ICD-10-CM | POA: Insufficient documentation

## 2025-01-17 DIAGNOSIS — J449 Chronic obstructive pulmonary disease, unspecified: Secondary | ICD-10-CM | POA: Insufficient documentation

## 2025-01-17 DIAGNOSIS — Z87891 Personal history of nicotine dependence: Secondary | ICD-10-CM | POA: Insufficient documentation

## 2025-01-17 DIAGNOSIS — R079 Chest pain, unspecified: Principal | ICD-10-CM | POA: Diagnosis present

## 2025-01-17 DIAGNOSIS — R0789 Other chest pain: Principal | ICD-10-CM | POA: Insufficient documentation

## 2025-01-17 DIAGNOSIS — Z79899 Other long term (current) drug therapy: Secondary | ICD-10-CM | POA: Insufficient documentation

## 2025-01-17 LAB — COMPREHENSIVE METABOLIC PANEL WITH GFR
ALT: 7 U/L (ref 0–44)
AST: 17 U/L (ref 15–41)
Albumin: 4.6 g/dL (ref 3.5–5.0)
Alkaline Phosphatase: 85 U/L (ref 38–126)
Anion gap: 14 (ref 5–15)
BUN: 13 mg/dL (ref 6–20)
CO2: 23 mmol/L (ref 22–32)
Calcium: 9.3 mg/dL (ref 8.9–10.3)
Chloride: 104 mmol/L (ref 98–111)
Creatinine, Ser: 0.91 mg/dL (ref 0.44–1.00)
GFR, Estimated: >60 mL/min
Glucose, Bld: 139 mg/dL — ABNORMAL HIGH (ref 70–99)
Potassium: 3.6 mmol/L (ref 3.5–5.1)
Sodium: 141 mmol/L (ref 135–145)
Total Bilirubin: 1.2 mg/dL (ref 0.0–1.2)
Total Protein: 7.3 g/dL (ref 6.5–8.1)

## 2025-01-17 LAB — BASIC METABOLIC PANEL WITH GFR
Anion gap: 16 — ABNORMAL HIGH (ref 5–15)
BUN: 14 mg/dL (ref 6–20)
CO2: 22 mmol/L (ref 22–32)
Calcium: 9.9 mg/dL (ref 8.9–10.3)
Chloride: 102 mmol/L (ref 98–111)
Creatinine, Ser: 0.99 mg/dL (ref 0.44–1.00)
GFR, Estimated: >60 mL/min
Glucose, Bld: 94 mg/dL (ref 70–99)
Potassium: 3.7 mmol/L (ref 3.5–5.1)
Sodium: 140 mmol/L (ref 135–145)

## 2025-01-17 LAB — TROPONIN T, HIGH SENSITIVITY
Troponin T High Sensitivity: <6 ng/L (ref 0–19)
Troponin T High Sensitivity: <6 ng/L (ref 0–19)
Troponin T High Sensitivity: 6 ng/L (ref 0–19)
Troponin T High Sensitivity: <6 ng/L (ref 0–19)

## 2025-01-17 LAB — CBC WITH DIFFERENTIAL/PLATELET
Abs Immature Granulocytes: 0.02 K/uL (ref 0.00–0.07)
Basophils Absolute: 0 K/uL (ref 0.0–0.1)
Basophils Relative: 0 %
Eosinophils Absolute: 0.1 K/uL (ref 0.0–0.5)
Eosinophils Relative: 1 %
HCT: 37.3 % (ref 36.0–46.0)
Hemoglobin: 12.3 g/dL (ref 12.0–15.0)
Immature Granulocytes: 0 %
Lymphocytes Relative: 25 %
Lymphs Abs: 2.4 K/uL (ref 0.7–4.0)
MCH: 29.8 pg (ref 26.0–34.0)
MCHC: 33 g/dL (ref 30.0–36.0)
MCV: 90.3 fL (ref 80.0–100.0)
Monocytes Absolute: 0.3 K/uL (ref 0.1–1.0)
Monocytes Relative: 3 %
Neutro Abs: 6.6 K/uL (ref 1.7–7.7)
Neutrophils Relative %: 71 %
Platelets: 227 K/uL (ref 150–400)
RBC: 4.13 MIL/uL (ref 3.87–5.11)
RDW: 12.9 % (ref 11.5–15.5)
WBC: 9.4 K/uL (ref 4.0–10.5)
nRBC: 0 % (ref 0.0–0.2)

## 2025-01-17 MED ORDER — SODIUM CHLORIDE 0.9 % IV BOLUS
1000.0000 mL | Freq: Once | INTRAVENOUS | Status: AC
  Administered 2025-01-17: 1000 mL via INTRAVENOUS

## 2025-01-17 MED ORDER — ASPIRIN 81 MG PO CHEW
324.0000 mg | CHEWABLE_TABLET | Freq: Once | ORAL | Status: AC
  Administered 2025-01-17: 324 mg via ORAL
  Filled 2025-01-17: qty 4

## 2025-01-17 MED ORDER — ONDANSETRON HCL 4 MG/2ML IJ SOLN
4.0000 mg | Freq: Once | INTRAMUSCULAR | Status: AC
  Administered 2025-01-17: 4 mg via INTRAVENOUS
  Filled 2025-01-17: qty 2

## 2025-01-17 MED ORDER — NITROGLYCERIN 0.4 MG SL SUBL
0.4000 mg | SUBLINGUAL_TABLET | SUBLINGUAL | Status: DC | PRN
  Administered 2025-01-17 – 2025-01-18 (×2): 0.4 mg via SUBLINGUAL
  Filled 2025-01-17 (×2): qty 1

## 2025-01-17 NOTE — ED Notes (Signed)
Patient transported to [facility] via [facility] 

## 2025-01-17 NOTE — ED Notes (Signed)
Pt stated she was leaving.  

## 2025-01-17 NOTE — ED Provider Notes (Signed)
 " [place] EMERGENCY DEPARTMENT AT [facility] Provider Note   CSN: [id number] Arrival date & time: 01/17/25  1434     Patient presents with: Chest Pain   April Ford is a 59 y.o. female.   HPI 59 year old female with a history of COPD presents with chest pain.  Started at 4 AM on 1/22.  It is a tightness and pressure.  Has been a constant sensation since onset.  Got bad enough that she went to [facility] last night and had blood work drawn and an x-ray and ECG.  However due to a long wait she left at around 3 AM.  The pain has been present ever since.  No radiation of the pain, diaphoresis or vomiting though she does have some nausea.  No abdominal or back pain.  Some mild dyspnea.  Nothing she does makes the pain better or worse including exertion, position, breathing.  No leg swelling or recent travel.  Prior to Admission medications  Medication Sig Start Date End Date Taking? Authorizing Provider  aspirin  EC 325 MG tablet Take 325 mg by mouth every 6 (six) hours as needed (chest pain).    [provider]  clobetasol  ointment (TEMOVATE ) 0.05 % Apply 1 Application topically 2 (two) times daily. 12/10/24   Cook, [name], DO  hydrOXYzine  (VISTARIL ) 25 MG capsule Take 1 capsule (25 mg total) by mouth every 8 (eight) hours as needed for itching. 12/10/24   Cook, [name], DO  omeprazole  (PRILOSEC) 20 MG capsule Take 1 capsule (20 mg total) by mouth daily. 11/27/24   Theadore Ozell HERO, MD  sucralfate  (CARAFATE ) 1 g tablet Take 1 tablet (1 g total) by mouth 4 (four) times daily as needed. 11/27/24   Bero, Michael M, MD    Allergies: Patient has no known allergies.    Review of Systems  Respiratory:  Positive for chest tightness and shortness of breath.   Cardiovascular:  Positive for chest pain. Negative for leg swelling.  Gastrointestinal:  Positive for nausea. Negative for abdominal pain and vomiting.  Musculoskeletal:  Negative for back pain.    Updated Vital  Signs BP 108/65   Pulse 63   Temp 98.3 F (36.8 C) (Oral)   Resp 20   Ht 5' 7 (1.702 m)   Wt 79.4 kg   SpO2 98%   BMI 27.41 kg/m   Physical Exam Vitals and nursing note reviewed.  Constitutional:      Appearance: She is well-developed.  HENT:     Head: Normocephalic and atraumatic.  Cardiovascular:     Rate and Rhythm: Normal rate and regular rhythm.     Heart sounds: Normal heart sounds.  Pulmonary:     Effort: Pulmonary effort is normal.     Breath sounds: Normal breath sounds.  Abdominal:     Palpations: Abdomen is soft.     Tenderness: There is no abdominal tenderness.  Musculoskeletal:     Right lower leg: No tenderness. No edema.     Left lower leg: No tenderness. No edema.  Skin:    General: Skin is warm and dry.  Neurological:     Mental Status: She is alert.     (all labs ordered are listed, but only abnormal results are displayed) Labs Reviewed  COMPREHENSIVE METABOLIC PANEL WITH GFR - Abnormal; Notable for the following components:      Result Value   Glucose, Bld 139 (*)    All other components within normal limits  CBC  WITH DIFFERENTIAL/PLATELET  TROPONIN T, HIGH SENSITIVITY  TROPONIN T, HIGH SENSITIVITY    EKG: EKG Interpretation Date/Time:  Friday January 17 2025 15:03:11 EST Ventricular Rate:  59 PR Interval:  156 QRS Duration:  80 QT Interval:  418 QTC Calculation: 413 R Axis:   71  Text Interpretation: Sinus bradycardia  T wave changes improved compared to yesterday Confirmed by Freddi Hamilton [phone]) on 01/17/2025 8:45:30 PM  Radiology: [facility] Chest 2 View Result Date: 01/16/2025 EXAM: 2 VIEW(S) XRAY OF THE CHEST 01/16/2025 11:32:00 PM COMPARISON: 11/26/2024 CLINICAL HISTORY: Chest pain. FINDINGS: LUNGS AND PLEURA: No focal pulmonary opacity. No pleural effusion. No pneumothorax. HEART AND MEDIASTINUM: No acute abnormality of the cardiac and mediastinal silhouettes. BONES AND SOFT TISSUES: No acute osseous abnormality. IMPRESSION: 1. No  acute cardiopulmonary pathology. Electronically signed by: Pinkie Pebbles MD 01/16/2025 11:33 PM EST RP Workstation: [id number]     Procedures   Medications Ordered in the ED  nitroGLYCERIN  (NITROSTAT ) SL tablet 0.4 mg (0.4 mg Sublingual Given 01/17/25 2202)  sodium chloride  0.9 % bolus 1,000 mL (1,000 mLs Intravenous New Bag/Given 01/17/25 2143)  ondansetron  (ZOFRAN ) injection 4 mg (4 mg Intravenous Given 01/17/25 2144)  aspirin  chewable tablet 324 mg (324 mg Oral Given 01/17/25 2102)                                    Medical Decision Making Amount and/or Complexity of Data Reviewed Labs: ordered.    Details: Normal troponins. ECG/medicine tests: ordered and independent interpretation performed.    Details: T wave changes improved and ECG now normal  Risk OTC drugs. Prescription drug management. Decision regarding hospitalization.   Patient's chest pain is not classic for ACS.  However her ECG yesterday showed diffuse T wave inversions that have now resolved.  Her chest pain has remained constant.  Her troponins are negative.  I discussed her case with cardiology on-call, Dr. Hillary.  Given this, he is recommending CT coronary for evaluation.  This is not available at this hospital.  Plan is decided to admit her to the hospitalist service and transfer her to [facility] and I have also requested cardiology to see her at [facility].  Discussed with Dr. Adefeso for admission.  She had a negative workup for PE about a month ago.  Her vital signs are not indicative of likely PE and she has no other obvious risk factors and I think PE is unlikely.     Final diagnoses:  Nonspecific chest pain    ED Discharge Orders     None          Freddi Hamilton, MD 01/17/25 2258  "

## 2025-01-17 NOTE — H&P (Signed)
 " History and Physical    Patient: April Ford FMW:[id number] DOB: 06-06-1966 DOA: 01/17/2025 DOS: the patient was seen and examined on 01/17/2025 PCP: Cook, [name], DO  Patient coming from: Home  Chief Complaint:  Chief Complaint  Patient presents with   Chest Pain   HPI: April Ford is a 59 y.o. female with medical history significant of COPD who presents to the emergency department due to chest pain which started around 4 PM on 1/22, chest pain was described as sensation of heaviness on chest without radiation, pain was constant and rated as 7/10 on pain scale.  She went to [facility], ED (lives in [place]) last night where EKG, blood draw and x-ray was done, but she left around 3 AM (1/23) due to the long wait.  She endorsed worsening chest pain when she came to see her daughter here at [facility] today and daughter asked her to go to the ED for further evaluation.  She endorsed some nausea, but denies vomiting, abdominal or back pain.  ED course In the Emergency Department, she was hemodynamically stable.  Workup in the ED showed normal CBC and BMP except for blood glucose of 139.  Troponin x 2 drawn at [facility] ED yesterday was negative, repeated troponin x 2 done today was negative. Chest x-ray showed no acute cardiopulmonary pathology Aspirin  324 mg was given Zofran  was provided.  Nitroglycerin  sublingual x 1 was given with improvement in chest pain.  IV hydration was provided. EKG personally reviewed shows sinus bradycardia at a rate of 59 bpm Cardiologist on-call (Dr. Hillary) recommended CT coronary for evaluation, this is not available at this hospital, patient was admitted to [facility] with cardiology consult to see patient in the morning per EDP.     Review of Systems: As mentioned in the history of present illness. All other systems reviewed and are negative. Past Medical History:  Diagnosis Date   Bronchiolitis    COPD (chronic obstructive pulmonary disease) (HCC)    Past  Surgical History:  Procedure Laterality Date   ABDOMINAL HYSTERECTOMY     COLONOSCOPY WITH PROPOFOL  N/A 01/26/2024   Procedure: COLONOSCOPY WITH PROPOFOL ;  Surgeon: Cindie Carlin POUR, DO;  Location: [facility];  Service: Endoscopy;  Laterality: N/A;  900AM, ASA 3   Social History:  reports that she quit smoking about 3 years ago. Her smoking use included cigarettes. She started smoking about 44 years ago. She has never used smokeless tobacco. She reports that she does not currently use alcohol. She reports current drug use. Drug: Marijuana.  Allergies[1]  History reviewed. No pertinent family history.  Prior to Admission medications  Medication Sig Start Date End Date Taking? Authorizing Provider  aspirin  EC 325 MG tablet Take 325 mg by mouth every 6 (six) hours as needed (chest pain).    [provider]  clobetasol  ointment (TEMOVATE ) 0.05 % Apply 1 Application topically 2 (two) times daily. 12/10/24   Cook, [name], DO  hydrOXYzine  (VISTARIL ) 25 MG capsule Take 1 capsule (25 mg total) by mouth every 8 (eight) hours as needed for itching. 12/10/24   Cook, [name], DO  omeprazole  (PRILOSEC) 20 MG capsule Take 1 capsule (20 mg total) by mouth daily. 11/27/24   Theadore Ozell HERO, MD  sucralfate  (CARAFATE ) 1 g tablet Take 1 tablet (1 g total) by mouth 4 (four) times daily as needed. 11/27/24   Theadore Ozell HERO, MD    Physical Exam: Vitals:   01/17/25 2100 01/17/25 2145 01/17/25 2200  01/17/25 2215  BP: (!) 146/83 127/79 (!) 144/83 108/65  Pulse: (!) 55 (!) 54 (!) 57 63  Resp: 16 14 (!) 22 20  Temp:      TempSrc:      SpO2: 98% 98% 97% 98%  Weight:      Height:       General: Awake and alert and oriented x3. Not in any acute distress.  HEENT: NCAT.  PERRLA. EOMI. Sclerae anicteric.  Moist mucosal membranes. Neck: Neck supple without lymphadenopathy. No carotid bruits. No masses palpated.  Cardiovascular: Regular rate with normal S1-S2 sounds. No murmurs, rubs or gallops  auscultated. No JVD.  Respiratory: Clear breath sounds.  No accessory muscle use. Abdomen: Soft, nontender, nondistended. Active bowel sounds. No masses or hepatosplenomegaly  Skin: No rashes, lesions, or ulcerations.  Dry, warm to touch. Musculoskeletal:  2+ dorsalis pedis and radial pulses. Good ROM.  No contractures  Psychiatric: Intact judgment and insight.  Mood appropriate to current condition. Neurologic: No focal neurological deficits. Strength is 5/5 x 4.  CN II - XII grossly intact.  Assessment and Plan: Atypical chest pain Continue telemetry  Troponins x2 - 6 , <6 EKG personally reviewed showed sinus bradycardia at a rate of 59 bpm Cardiology (Dr. Hillary) was consulted, CT coronary recommended and patient will be admitted to [facility] for this imaging studies and to be followed with cardiology team on arrival to [facility]. Aspirin  324 mg was given, sodium nitroglycerin  x 1 was given with some improvement in chest pain Continue aspirin  81 mg p.o. daily and nitroglycerin  prn.  COPD No home medication noted on med rec Continue DuoNebs as needed  GERD Continue Protonix    Advance Care Planning: Full code  Consults: Cardiology (by [facility] EDP)  Family Communication: Grandson at bedside  Severity of Illness: The appropriate patient status for this patient is INPATIENT. Inpatient status is judged to be reasonable and necessary in order to provide the required intensity of service to ensure the patient's safety. The patient's presenting symptoms, physical exam findings, and initial radiographic and laboratory data in the context of their chronic comorbidities is felt to place them at high risk for further clinical deterioration. Furthermore, it is not anticipated that the patient will be medically stable for discharge from the hospital within 2 midnights of admission.   * I certify that at the point of admission it is my clinical judgment that the patient will require inpatient hospital  care spanning beyond 2 midnights from the point of admission due to high intensity of service, high risk for further deterioration and high frequency of surveillance required.*  Author: Dejuana Weist, DO 01/17/2025 10:31 PM  For on call review [email or web address].      [1] No Known Allergies  "

## 2025-01-17 NOTE — ED Triage Notes (Signed)
 Pt comes in for central chest pain. Pt states it feels like an elephant been sitting on her chest. S/s started 0400 yesterday. Pt went to [facility] but left AMA but to the long wait. A&Ox4.

## 2025-01-18 ENCOUNTER — Inpatient Hospital Stay (HOSPITAL_COMMUNITY)

## 2025-01-18 DIAGNOSIS — R079 Chest pain, unspecified: Secondary | ICD-10-CM

## 2025-01-18 DIAGNOSIS — I200 Unstable angina: Secondary | ICD-10-CM

## 2025-01-18 LAB — COMPREHENSIVE METABOLIC PANEL WITH GFR
ALT: 5 U/L (ref 0–44)
AST: 16 U/L (ref 15–41)
Albumin: 3.9 g/dL (ref 3.5–5.0)
Alkaline Phosphatase: 75 U/L (ref 38–126)
Anion gap: 13 (ref 5–15)
BUN: 12 mg/dL (ref 6–20)
CO2: 22 mmol/L (ref 22–32)
Calcium: 9 mg/dL (ref 8.9–10.3)
Chloride: 106 mmol/L (ref 98–111)
Creatinine, Ser: 0.9 mg/dL (ref 0.44–1.00)
GFR, Estimated: >60 mL/min
Glucose, Bld: 109 mg/dL — ABNORMAL HIGH (ref 70–99)
Potassium: 3.6 mmol/L (ref 3.5–5.1)
Sodium: 141 mmol/L (ref 135–145)
Total Bilirubin: 1.1 mg/dL (ref 0.0–1.2)
Total Protein: 6.4 g/dL — ABNORMAL LOW (ref 6.5–8.1)

## 2025-01-18 LAB — HIV ANTIBODY (ROUTINE TESTING W REFLEX): HIV Screen 4th Generation wRfx: NONREACTIVE

## 2025-01-18 LAB — CBC
HCT: 33.9 % — ABNORMAL LOW (ref 36.0–46.0)
Hemoglobin: 11.3 g/dL — ABNORMAL LOW (ref 12.0–15.0)
MCH: 30 pg (ref 26.0–34.0)
MCHC: 33.3 g/dL (ref 30.0–36.0)
MCV: 89.9 fL (ref 80.0–100.0)
Platelets: 197 10*3/uL (ref 150–400)
RBC: 3.77 MIL/uL — ABNORMAL LOW (ref 3.87–5.11)
RDW: 12.8 % (ref 11.5–15.5)
WBC: 8.7 10*3/uL (ref 4.0–10.5)
nRBC: 0 % (ref 0.0–0.2)

## 2025-01-18 LAB — ECHOCARDIOGRAM COMPLETE
Area-P 1/2: 2.24 cm2
Height: 67 in
S' Lateral: 2.6 cm
Weight: 2800 [oz_av]

## 2025-01-18 LAB — PHOSPHORUS: Phosphorus: 3.5 mg/dL (ref 2.5–4.6)

## 2025-01-18 LAB — MAGNESIUM: Magnesium: 2.1 mg/dL (ref 1.7–2.4)

## 2025-01-18 MED ORDER — ONDANSETRON HCL 4 MG PO TABS: 4.0000 mg | ORAL_TABLET | Freq: Four times a day (QID) | ORAL | Status: DC | PRN

## 2025-01-18 MED ORDER — NITROGLYCERIN 0.4 MG SL SUBL
SUBLINGUAL_TABLET | SUBLINGUAL | Status: AC
  Filled 2025-01-18: qty 2

## 2025-01-18 MED ORDER — SUCRALFATE 1 G PO TABS: 1.0000 g | ORAL_TABLET | Freq: Three times a day (TID) | ORAL | 0 refills | Status: AC

## 2025-01-18 MED ORDER — ASPIRIN 81 MG PO TBEC
81.0000 mg | DELAYED_RELEASE_TABLET | Freq: Every day | ORAL | Status: DC
  Administered 2025-01-18: 81 mg via ORAL
  Filled 2025-01-18: qty 1

## 2025-01-18 MED ORDER — ACETAMINOPHEN 325 MG PO TABS
650.0000 mg | ORAL_TABLET | Freq: Four times a day (QID) | ORAL | Status: DC | PRN
  Administered 2025-01-18: 650 mg via ORAL
  Filled 2025-01-18: qty 2

## 2025-01-18 MED ORDER — ONDANSETRON HCL 4 MG/2ML IJ SOLN: 4.0000 mg | Freq: Four times a day (QID) | INTRAMUSCULAR | Status: DC | PRN

## 2025-01-18 MED ORDER — IPRATROPIUM-ALBUTEROL 0.5-2.5 (3) MG/3ML IN SOLN: 3.0000 mL | Freq: Four times a day (QID) | RESPIRATORY_TRACT | Status: DC | PRN

## 2025-01-18 MED ORDER — SUCRALFATE 1 G PO TABS
1.0000 g | ORAL_TABLET | Freq: Three times a day (TID) | ORAL | Status: DC
  Administered 2025-01-18: 1 g via ORAL
  Filled 2025-01-18 (×3): qty 1

## 2025-01-18 MED ORDER — PERFLUTREN LIPID MICROSPHERE
1.0000 mL | INTRAVENOUS | Status: DC | PRN
  Administered 2025-01-18: 2 mL via INTRAVENOUS

## 2025-01-18 MED ORDER — OMEPRAZOLE 20 MG PO CPDR: 20.0000 mg | DELAYED_RELEASE_CAPSULE | Freq: Two times a day (BID) | ORAL | 1 refills | Status: AC

## 2025-01-18 MED ORDER — ENOXAPARIN SODIUM 40 MG/0.4ML IJ SOSY
40.0000 mg | PREFILLED_SYRINGE | INTRAMUSCULAR | Status: DC
  Administered 2025-01-18: 40 mg via SUBCUTANEOUS
  Filled 2025-01-18: qty 0.4

## 2025-01-18 MED ORDER — PANTOPRAZOLE SODIUM 40 MG PO TBEC
40.0000 mg | DELAYED_RELEASE_TABLET | Freq: Every day | ORAL | Status: DC
  Administered 2025-01-18: 40 mg via ORAL
  Filled 2025-01-18: qty 1

## 2025-01-18 MED ORDER — ACETAMINOPHEN 650 MG RE SUPP: 650.0000 mg | Freq: Four times a day (QID) | RECTAL | Status: DC | PRN

## 2025-01-18 MED ORDER — IOHEXOL 350 MG/ML SOLN
80.0000 mL | Freq: Once | INTRAVENOUS | Status: AC | PRN
  Administered 2025-01-18: 80 mL via INTRAVENOUS

## 2025-01-18 MED ORDER — ASPIRIN 81 MG PO TBEC: 81.0000 mg | DELAYED_RELEASE_TABLET | Freq: Every day | ORAL | Status: AC

## 2025-01-18 NOTE — Consult Note (Signed)
 "  Cardiology Consultation   Patient ID: April Ford MRN: [id number]; DOB: [birth year]  Admit date: 01/17/2025 Date of Consult: 01/18/2025  PCP:  Cook, [name], DO   [facility] Providers Cardiologist:  None        Patient Profile: April Ford is a 59 y.o. female with a hx of COPD, hyperlipidemia, IBS who is being seen 01/18/2025 for the evaluation of chest pain at the request of [facility].  History of Present Illness: April Ford reports onset of chest pain Thursday morning that woke her up from sleep at 4am although she had previous symptoms that weren't as significant prior. There was associated dyspnea. She thinks that her dyspnea is a little worse when laying flat. No LE edema but appetite is poor. The pain felt like pressure on her chest sub-sternally. She did not feel her pain worsened with exertion. She eventually presented to [facility] ED and ultimately left after she saw a long wait. She tried to ignore the symptoms but they got worse. She then went to the hospital in [place]. There her troponin was <6. Due to ECG changes she was transferred to [facility] for further evaluation.   Currently chest pain free.   Mom had 3v CABG in her 19's.    Past Medical History:  Diagnosis Date   Bronchiolitis    COPD (chronic obstructive pulmonary disease) (HCC)     Past Surgical History:  Procedure Laterality Date   ABDOMINAL HYSTERECTOMY     COLONOSCOPY WITH PROPOFOL  N/A 01/26/2024   Procedure: COLONOSCOPY WITH PROPOFOL ;  Surgeon: Cindie Carlin POUR, DO;  Location: [facility];  Service: Endoscopy;  Laterality: N/A;  900AM, ASA 3     Home Medications:  Prior to Admission medications  Medication Sig Start Date End Date Taking? Authorizing Provider  aspirin  EC 325 MG tablet Take 325 mg by mouth every 6 (six) hours as needed (chest pain).    [provider]  clobetasol  ointment (TEMOVATE ) 0.05 % Apply 1 Application topically 2 (two) times daily. 12/10/24   Cook,  [name], DO  hydrOXYzine  (VISTARIL ) 25 MG capsule Take 1 capsule (25 mg total) by mouth every 8 (eight) hours as needed for itching. 12/10/24   Cook, [name], DO  omeprazole  (PRILOSEC) 20 MG capsule Take 1 capsule (20 mg total) by mouth daily. 11/27/24   Theadore Ozell HERO, MD  sucralfate  (CARAFATE ) 1 g tablet Take 1 tablet (1 g total) by mouth 4 (four) times daily as needed. 11/27/24   Theadore Ozell HERO, MD    Scheduled Meds:  aspirin  EC  81 mg Oral Daily   enoxaparin  (LOVENOX ) injection  40 mg Subcutaneous Q24H   Continuous Infusions:  PRN Meds: acetaminophen  **OR** acetaminophen , ipratropium-albuterol , nitroGLYCERIN , ondansetron  **OR** ondansetron  (ZOFRAN ) IV  Allergies:   Allergies[1]  Social History:   Social History   Socioeconomic History   Marital status: Single    Spouse name: Not on file   Number of children: Not on file   Years of education: Not on file   Highest education level: Not on file  Occupational History   Not on file  Tobacco Use   Smoking status: Former    Current packs/day: 0.00    Types: Cigarettes    Start date: 06/23/1980    Quit date: 06/23/2021    Years since quitting: 3.5   Smokeless tobacco: Never  Vaping Use   Vaping status: Some Days   Start date: 02/23/2022   Substances: Flavoring  Substance and Sexual  Activity   Alcohol use: Not Currently   Drug use: Yes    Types: Marijuana   Sexual activity: Not Currently    Birth control/protection: None  Other Topics Concern   Not on file  Social History Narrative   Not on file   Social Drivers of Health   Tobacco Use: Medium Risk (01/17/2025)   Patient History    Smoking Tobacco Use: Former    Smokeless Tobacco Use: Never    Passive Exposure: Not on Actuary Strain: Not on file  Food Insecurity: No Food Insecurity (01/18/2025)   Epic    Worried About Programme Researcher, Broadcasting/film/video in the Last Year: Never true    Ran Out of Food in the Last Year: Never true  Transportation Needs: No  Transportation Needs (01/18/2025)   Epic    Lack of Transportation (Medical): No    Lack of Transportation (Non-Medical): No  Physical Activity: Not on file  Stress: Not on file  Social Connections: Not on file  Intimate Partner Violence: Not At Risk (01/18/2025)   Epic    Fear of Current or Ex-Partner: No    Emotionally Abused: No    Physically Abused: No    Sexually Abused: No  Depression (PHQ2-9): High Risk (12/10/2024)   Depression (PHQ2-9)    PHQ-2 Score: 14  Alcohol Screen: Not on file  Housing: Low Risk (01/18/2025)   Epic    Unable to Pay for Housing in the Last Year: No    Number of Times Moved in the Last Year: 0    Homeless in the Last Year: No  Utilities: Not At Risk (01/18/2025)   Epic    Threatened with loss of utilities: No  Health Literacy: Not on file    Family History:   History reviewed. No pertinent family history.   ROS:  Please see the history of present illness.   All other ROS reviewed and negative.     Physical Exam/Data: Vitals:   01/17/25 2230 01/17/25 2245 01/17/25 2315 01/18/25 0410  BP: (!) 141/83 139/80 (!) 157/59 (!) 144/90  Pulse: (!) 54 61 (!) 55 (!) 55  Resp: (!) 21 (!) 24 (!) 21 20  Temp:      TempSrc:      SpO2: 99% 96% 100% 100%  Weight:      Height:       No intake or output data in the 24 hours ending 01/18/25 0626    01/17/2025    3:01 PM 01/16/2025   10:43 PM 12/10/2024   10:46 AM  Last 3 Weights  Weight (lbs) 175 lb 175 lb 182 lb  Weight (kg) 79.379 kg 79.379 kg 82.555 kg     Body mass index is 27.41 kg/m.  General:  Well nourished, well developed, in no acute distress HEENT: normal Neck: no JVD Vascular: No carotid bruits; Distal pulses 2+ bilaterally Cardiac:  normal S1, S2; RRR; no murmur  Lungs:  clear to auscultation bilaterally, no wheezing, rhonchi or rales  Abd: soft, nontender, no hepatomegaly  Ext: no edema Musculoskeletal:  No deformities, BUE and BLE strength normal and equal Skin: warm and dry   Neuro:  CNs 2-12 intact, no focal abnormalities noted Psych:  Normal affect   EKG:  The EKG was personally reviewed and demonstrates:  NSR Telemetry:  Telemetry was personally reviewed and demonstrates:  NSR  Relevant CV Studies: None  Laboratory Data: High Sensitivity Troponin:  No results for input(s): TROPONINIHS in the last 720  hours.  Recent Labs  Lab 01/16/25 2258 01/17/25 0106 01/17/25 1612 01/17/25 1725  TRNPT <6 <6 6 <6      Chemistry Recent Labs  Lab 01/16/25 2258 01/17/25 1612 01/18/25 0331  NA 140 141 141  K 3.7 3.6 3.6  CL 102 104 106  CO2 22 23 22   GLUCOSE 94 139* 109*  BUN 14 13 12   CREATININE 0.99 0.91 0.90  CALCIUM 9.9 9.3 9.0  MG  --   --  2.1  GFRNONAA >60 >60 >60  ANIONGAP 16* 14 13    Recent Labs  Lab 01/17/25 1612 01/18/25 0331  PROT 7.3 6.4*  ALBUMIN 4.6 3.9  AST 17 16  ALT 7 5  ALKPHOS 85 75  BILITOT 1.2 1.1   Lipids No results for input(s): CHOL, TRIG, HDL, LABVLDL, LDLCALC, CHOLHDL in the last 168 hours.  Hematology Recent Labs  Lab 01/16/25 2258 01/17/25 1612 01/18/25 0331  WBC 10.6* 9.4 8.7  RBC 4.34 4.13 3.77*  HGB 13.0 12.3 11.3*  HCT 39.3 37.3 33.9*  MCV 90.6 90.3 89.9  MCH 30.0 29.8 30.0  MCHC 33.1 33.0 33.3  RDW 12.8 12.9 12.8  PLT 243 227 197   Thyroid No results for input(s): TSH, FREET4 in the last 168 hours.  BNPNo results for input(s): BNP, PROBNP in the last 168 hours.  DDimer No results for input(s): DDIMER in the last 168 hours.  Radiology/Studies:  DG Chest 2 View Result Date: 01/16/2025 EXAM: 2 VIEW(S) XRAY OF THE CHEST 01/16/2025 11:32:00 PM COMPARISON: 11/26/2024 CLINICAL HISTORY: Chest pain. FINDINGS: LUNGS AND PLEURA: No focal pulmonary opacity. No pleural effusion. No pneumothorax. HEART AND MEDIASTINUM: No acute abnormality of the cardiac and mediastinal silhouettes. BONES AND SOFT TISSUES: No acute osseous abnormality. IMPRESSION: 1. No acute cardiopulmonary pathology.  Electronically signed by: Pinkie Pebbles MD 01/16/2025 11:33 PM EST RP Workstation: [id number]   Assessment and Plan: Chest pain - Symptoms are not classic for cardiac chest pain although she did have subtle ECG changes with T-wave inversions in lead II that resolved on repeat suggesting a possible cardiac etiology. Her troponins ruled out.  She is currently chest pain-free.  Given recurrent presentation for chest pain recommend we proceed with coronary CTA to rule out obstructive coronary artery disease. She does not appear to be in decompensated heart failure on exam. - Echocardiogram - if normal structure and function could discuss outpatient Coronary CTA with patient if it cannot be completed over the weekend. - Coronary CTA  Risk Assessment/Risk Scores:        For questions or updates, please contact [facility] Please consult [email or web address] for contact info under      Signed, Jalaine DELENA Newcomer, MD  01/18/2025 6:26 AM     [1] No Known Allergies  "

## 2025-01-18 NOTE — Discharge Instructions (Signed)
 Your CTA and echo were unrevealing.  Suspected GI source so will refer you back to Dr. Cindie for consideration of EGD

## 2025-01-18 NOTE — Plan of Care (Signed)
  Problem: Clinical Measurements: Goal: Will remain free from infection Outcome: Progressing   Problem: Activity: Goal: Risk for activity intolerance will decrease Outcome: Progressing   Problem: Nutrition: Goal: Adequate nutrition will be maintained Outcome: Progressing   Problem: Pain Managment: Goal: General experience of comfort will improve and/or be controlled Outcome: Progressing

## 2025-01-18 NOTE — Progress Notes (Signed)
"  °  Progress Note  Patient Name: April Ford Date of Encounter: 01/18/2025 [facility] Cardiologist: None   Interval Summary   Retrosternal chest discomfort started around 9:30. ECG performed during symptoms is normal.  Vital Signs Vitals:   01/17/25 2245 01/17/25 2315 01/18/25 0410 01/18/25 0811  BP: 139/80 (!) 157/59 (!) 144/90 137/77  Pulse: 61 (!) 55 (!) 55 (!) 51  Resp: (!) 24 (!) 21 20 20   Temp:      TempSrc:      SpO2: 96% 100% 100% 98%  Weight:      Height:       No intake or output data in the 24 hours ending 01/18/25 1020    01/17/2025    3:01 PM 01/16/2025   10:43 PM 12/10/2024   10:46 AM  Last 3 Weights  Weight (lbs) 175 lb 175 lb 182 lb  Weight (kg) 79.379 kg 79.379 kg 82.555 kg      Telemetry/ECG  Sinus bradycardia - Personally Reviewed  Physical Exam  GEN: No acute distress.   Neck: No JVD Cardiac: RRR, no murmurs, rubs, or gallops.  Respiratory: Clear to auscultation bilaterally. GI: Soft, nontender, non-distended  MS: No edema  Assessment & Plan  59 yo with COPD and hyperlipidemia and complaints of retrosternal chest discomfort at rest, transient dynamic T wave changes on ECG yesterday, low risk biomarkers.  Awaiting coronary CT angio and echo today.   For questions or updates, please contact [facility] Please consult [email or web address] for contact info under         Signed, Jerel Balding, MD   "

## 2025-01-18 NOTE — Progress Notes (Signed)
 Echocardiogram 2D Echocardiogram has been performed.  Merlynn Argyle 01/18/2025, 3:53 PM

## 2025-01-18 NOTE — Progress Notes (Signed)
 " PROGRESS NOTE    April Ford  FMW:[id number] DOB: 1966-04-08 DOA: 01/17/2025 PCP: Cook, [name], DO    Brief Narrative:   April Ford is a 59 y.o. female with medical history significant of COPD who presents to the emergency department due to chest pain which started around 4 PM on 1/22, chest pain was described as sensation of heaviness on chest without radiation, pain was constant and rated as 7/10 on pain scale.  She went to [facility], ED (lives in [place]) last night where EKG, blood draw and x-ray was done, but she left around 3 AM (1/23) due to the long wait.  She endorsed worsening chest pain when she came to see her daughter here at [facility] today and daughter asked her to go to the ED for further evaluation.    Assessment and Plan: Atypical chest pain Continue telemetry  Cardiology consulted - Await CTA and echo   COPD No home medication noted on med rec Continue DuoNebs as needed   GERD Continue Protonix    DVT prophylaxis: enoxaparin  (LOVENOX ) injection 40 mg Start: 01/18/25 1000 SCDs Start: 01/18/25 0148    Code Status: Full Code Family Communication:   Disposition Plan:  Level of care: Telemetry Status is: Inpatient     Consultants:  Cards   Subjective: Uncomfortable getting IV placed  Objective: Vitals:   01/17/25 2315 01/18/25 0410 01/18/25 0811 01/18/25 1025  BP: (!) 157/59 (!) 144/90 137/77 (!) 158/84  Pulse: (!) 55 (!) 55 (!) 51 61  Resp: (!) 21 20 20 20   Temp:    98.3 F (36.8 C)  TempSrc:    Oral  SpO2: 100% 100% 98% 98%  Weight:      Height:       No intake or output data in the 24 hours ending 01/18/25 1146 Filed Weights   01/17/25 1501  Weight: 79.4 kg    Examination:   General: Appearance:     Overweight female in no acute distress     Lungs:     Clear to auscultation bilaterally, respirations unlabored  Heart:    Normal heart rate. Normal rhythm. No murmurs, rubs, or gallops.    MS:   All extremities are intact.     Neurologic:   Awake, alert, oriented x 3. No apparent focal neurological           defect.        Data Reviewed: I have personally reviewed following labs and imaging studies  CBC: Recent Labs  Lab 01/16/25 2258 01/17/25 1612 01/18/25 0331  WBC 10.6* 9.4 8.7  NEUTROABS  --  6.6  --   HGB 13.0 12.3 11.3*  HCT 39.3 37.3 33.9*  MCV 90.6 90.3 89.9  PLT 243 227 197   Basic Metabolic Panel: Recent Labs  Lab 01/16/25 2258 01/17/25 1612 01/18/25 0331  NA 140 141 141  K 3.7 3.6 3.6  CL 102 104 106  CO2 22 23 22   GLUCOSE 94 139* 109*  BUN 14 13 12   CREATININE 0.99 0.91 0.90  CALCIUM 9.9 9.3 9.0  MG  --   --  2.1  PHOS  --   --  3.5   GFR: Estimated Creatinine Clearance: 73 mL/min (by C-G formula based on SCr of 0.9 mg/dL). Liver Function Tests: Recent Labs  Lab 01/17/25 1612 01/18/25 0331  AST 17 16  ALT 7 5  ALKPHOS 85 75  BILITOT 1.2 1.1  PROT 7.3 6.4*  ALBUMIN 4.6 3.9   No  results for input(s): LIPASE, AMYLASE in the last 168 hours. No results for input(s): AMMONIA in the last 168 hours. Coagulation Profile: No results for input(s): INR, PROTIME in the last 168 hours. Cardiac Enzymes: No results for input(s): CKTOTAL, CKMB, CKMBINDEX, TROPONINI in the last 168 hours. BNP (last 3 results) Recent Labs    11/26/24 1945  PROBNP <50.0   HbA1C: No results for input(s): HGBA1C in the last 72 hours. CBG: No results for input(s): GLUCAP in the last 168 hours. Lipid Profile: No results for input(s): CHOL, HDL, LDLCALC, TRIG, CHOLHDL, LDLDIRECT in the last 72 hours. Thyroid Function Tests: No results for input(s): TSH, T4TOTAL, FREET4, T3FREE, THYROIDAB in the last 72 hours. Anemia Panel: No results for input(s): VITAMINB12, FOLATE, FERRITIN, TIBC, IRON, RETICCTPCT in the last 72 hours. Sepsis Labs: No results for input(s): PROCALCITON, LATICACIDVEN in the last 168 hours.  No results found for  this or any previous visit (from the past 240 hours).       Radiology Studies: DG Chest 2 View Result Date: 01/16/2025 EXAM: 2 VIEW(S) XRAY OF THE CHEST 01/16/2025 11:32:00 PM COMPARISON: 11/26/2024 CLINICAL HISTORY: Chest pain. FINDINGS: LUNGS AND PLEURA: No focal pulmonary opacity. No pleural effusion. No pneumothorax. HEART AND MEDIASTINUM: No acute abnormality of the cardiac and mediastinal silhouettes. BONES AND SOFT TISSUES: No acute osseous abnormality. IMPRESSION: 1. No acute cardiopulmonary pathology. Electronically signed by: Pinkie Pebbles MD 01/16/2025 11:33 PM EST RP Workstation: [id number]        Scheduled Meds:  aspirin  EC  81 mg Oral Daily   enoxaparin  (LOVENOX ) injection  40 mg Subcutaneous Q24H   Continuous Infusions:   LOS: 1 day    Time spent: 45 minutes spent on chart review, discussion with nursing staff, consultants, updating family and interview/physical exam; more than 50% of that time was spent in counseling and/or coordination of care.    Harlene RAYMOND Bowl, DO [facility] Available via Epic secure chat 7am-7pm After these hours, please refer to coverage provider listed on [email or web address] 01/18/2025, 11:46 AM   "

## 2025-01-18 NOTE — Progress Notes (Signed)
 Notified by MD regarding patient's new chest pain 10/10, 12 lead EKG obtained, nitroglycerine 1 tablet SL provided, V/S were stable, patient feels better after few minutes, POC continues.    01/18/25 1025  Vitals  Temp 98.3 F (36.8 C)  Temp Source Oral  BP (!) 158/84  MAP (mmHg) 107  BP Location Left Arm  BP Method Automatic  Patient Position (if appropriate) Lying  Pulse Rate 61  Pulse Rate Source Monitor  ECG Heart Rate (!) 58  Resp 20  Level of Consciousness  Level of Consciousness Alert  MEWS COLOR  MEWS Score Color Green  Oxygen Therapy  SpO2 98 %  O2 Device Room Air  MEWS Score  MEWS Temp 0  MEWS Systolic 0  MEWS Pulse 0  MEWS RR 0  MEWS LOC 0  MEWS Score 0

## 2025-01-18 NOTE — Plan of Care (Signed)

## 2025-01-18 NOTE — Progress Notes (Signed)
 Discharge instructions provided to the patient, she is aware regarding GI appointment, work letter provided, educated regarding medications, PIV removed, CCMD notified, had no any concerns during, pt waiting for transportation.

## 2025-01-18 NOTE — Discharge Summary (Signed)
 "     Physician Discharge Summary  April Ford FMW:[id number] DOB: Aug 27, 1966 DOA: 01/17/2025  PCP: Bluford Jacqulyn MATSU, DO  Admit date: 01/17/2025 Discharge date: 01/18/2025  Admitted From:  Discharge disposition: Home   Recommendations for Outpatient Follow-Up:   Needs to follow-up with GI for consideration of EGD - Cardiac workup unrevealing   Discharge Diagnosis:   Principal Problem:   Chest pain    Discharge Condition: Improved.  Diet recommendation: Low sodium, heart healthy.   Wound care: None.  Code status: Full.   History of Present Illness:   April Ford is a 59 y.o. female with medical history significant of COPD who presents to the emergency department due to chest pain which started around 4 PM on 1/22, chest pain was described as sensation of heaviness on chest without radiation, pain was constant and rated as 7/10 on pain scale.  She went to [facility], ED (lives in [place]) last night where EKG, blood draw and x-ray was done, but she left around 3 AM (1/23) due to the long wait.  She endorsed worsening chest pain when she came to see her daughter here at [facility] today and daughter asked her to go to the ED for further evaluation.  She endorsed some nausea, but denies vomiting, abdominal or back pain.   ED course In the Emergency Department, she was hemodynamically stable.  Workup in the ED showed normal CBC and BMP except for blood glucose of 139.  Troponin x 2 drawn at [facility] ED yesterday was negative, repeated troponin x 2 done today was negative. Chest x-ray showed no acute cardiopulmonary pathology Aspirin  324 mg was given Zofran  was provided.  Nitroglycerin  sublingual x 1 was given with improvement in chest pain.  IV hydration was provided. EKG personally reviewed shows sinus bradycardia at a rate of 59 bpm Cardiologist on-call (Dr. Hillary) recommended CT coronary for evaluation, this is not available at this hospital, patient was admitted to [facility]  with cardiology consult to see patient in the morning per EDP.     Hospital Course by Problem:   Atypical chest pain Continue telemetry  Cardiology consulted - CTA and echo normal-discussed with cardiology   COPD No home medication noted on med rec -Defer to PCP   GERD Continue Protonix     Medical Consultants:    Cardiology  Discharge Exam:   Vitals:   01/18/25 1158 01/18/25 1626  BP: 127/83 (!) 130/97  Pulse: 78 (!) 58  Resp: 14 18  Temp: 98 F (36.7 C) 98.3 F (36.8 C)  SpO2: 91% 100%   Vitals:   01/18/25 0811 01/18/25 1025 01/18/25 1158 01/18/25 1626  BP: 137/77 (!) 158/84 127/83 (!) 130/97  Pulse: (!) 51 61 78 (!) 58  Resp: 20 20 14 18   Temp:  98.3 F (36.8 C) 98 F (36.7 C) 98.3 F (36.8 C)  TempSrc:  Oral Oral Oral  SpO2: 98% 98% 91% 100%  Weight:      Height:        General exam: Appears calm and comfortable.    The results of significant diagnostics from this hospitalization (including imaging, microbiology, ancillary and laboratory) are listed below for reference.     Procedures and Diagnostic Studies:   ECHOCARDIOGRAM COMPLETE Result Date: 01/18/2025    ECHOCARDIOGRAM REPORT   Patient Name:   April Ford Date of Exam: 01/18/2025 Medical Rec #:  [id number]    Height:       67.0 in Accession #:  [id number]   Weight:       175.0 lb Date of Birth:  [birth year]    BSA:          1.911 m Patient Age:    58 years     BP:           127/83 mmHg Patient Gender: F            HR:           52 bpm. Exam Location:  Inpatient Procedure: 2D Echo, Cardiac Doppler, Color Doppler and Intracardiac            Opacification Agent (Both Spectral and Color Flow Doppler were            utilized during procedure). Indications:    Chest Pain  History:        Patient has no prior history of Echocardiogram examinations.                 COPD; Risk Factors:Dyslipidemia and Former Smoker.  Sonographer:    Merlynn Argyle Referring Phys: JALAINE DELENA NEWCOMER  Sonographer Comments: Image  acquisition challenging due to respiratory motion. IMPRESSIONS  1. Left ventricular ejection fraction, by estimation, is 60 to 65%. The left ventricle has normal function. The left ventricle has no regional wall motion abnormalities. There is mild left ventricular hypertrophy. Left ventricular diastolic parameters were normal.  2. Right ventricular systolic function is normal. The right ventricular size is normal. Tricuspid regurgitation signal is inadequate for assessing PA pressure.  3. The mitral valve is normal in structure. Trivial mitral valve regurgitation. No evidence of mitral stenosis.  4. The aortic valve is tricuspid. There is mild thickening of the aortic valve. Aortic valve regurgitation is not visualized. No aortic stenosis is present.  5. The inferior vena cava is normal in size with greater than 50% respiratory variability, suggesting right atrial pressure of 3 mmHg. FINDINGS  Left Ventricle: Left ventricular ejection fraction, by estimation, is 60 to 65%. The left ventricle has normal function. The left ventricle has no regional wall motion abnormalities. Definity  contrast agent was given IV to delineate the left ventricular  endocardial borders. The left ventricular internal cavity size was normal in size. There is mild left ventricular hypertrophy. Left ventricular diastolic parameters were normal. Right Ventricle: The right ventricular size is normal. No increase in right ventricular wall thickness. Right ventricular systolic function is normal. Tricuspid regurgitation signal is inadequate for assessing PA pressure. Left Atrium: Left atrial size was normal in size. Right Atrium: Right atrial size was normal in size. Pericardium: There is no evidence of pericardial effusion. Mitral Valve: The mitral valve is normal in structure. Trivial mitral valve regurgitation. No evidence of mitral valve stenosis. Tricuspid Valve: The tricuspid valve is normal in structure. Tricuspid valve regurgitation is  not demonstrated. No evidence of tricuspid stenosis. Aortic Valve: The aortic valve is tricuspid. There is mild thickening of the aortic valve. Aortic valve regurgitation is not visualized. No aortic stenosis is present. Pulmonic Valve: The pulmonic valve was normal in structure. Pulmonic valve regurgitation is mild. No evidence of pulmonic stenosis. Aorta: The aortic root is normal in size and structure. Venous: The inferior vena cava is normal in size with greater than 50% respiratory variability, suggesting right atrial pressure of 3 mmHg. IAS/Shunts: No atrial level shunt detected by color flow Doppler.  LEFT VENTRICLE PLAX 2D LVIDd:         4.30 cm   Diastology LVIDs:  2.60 cm   LV e' medial:    10.60 cm/s LV PW:         1.10 cm   LV E/e' medial:  6.6 LV IVS:        1.00 cm   LV e' lateral:   13.80 cm/s LVOT diam:     1.80 cm   LV E/e' lateral: 5.1 LV SV:         64 LV SV Index:   33 LVOT Area:     2.54 cm LV IVRT:       113 msec  RIGHT VENTRICLE             IVC RV Basal diam:  4.00 cm     IVC diam: 0.90 cm RV Mid diam:    3.10 cm RV S prime:     15.90 cm/s TAPSE (M-mode): 2.4 cm LEFT ATRIUM             Index        RIGHT ATRIUM           Index LA diam:        2.80 cm 1.47 cm/m   RA Area:     12.50 cm LA Vol (A2C):   44.0 ml 23.03 ml/m  RA Volume:   28.10 ml  14.71 ml/m LA Vol (A4C):   35.8 ml 18.74 ml/m LA Biplane Vol: 40.7 ml 21.30 ml/m  AORTIC VALVE LVOT Vmax:   121.00 cm/s LVOT Vmean:  82.900 cm/s LVOT VTI:    0.250 m  AORTA Ao Root diam: 2.90 cm Ao Asc diam:  3.00 cm MITRAL VALVE MV Area (PHT): 2.24 cm    SHUNTS MV Decel Time: 338 msec    Systemic VTI:  0.25 m MV E velocity: 70.00 cm/s  Systemic Diam: 1.80 cm MV A velocity: 85.00 cm/s MV E/A ratio:  0.82 Soyla Merck MD Electronically signed by Soyla Merck MD Signature Date/Time: 01/18/2025/4:14:02 PM    Final    CT CORONARY MORPH W/CTA COR W/SCORE DEL W/CM &/OR WO/CM Result Date: 01/18/2025 HISTORY: Chest pain/anginal equiv, ECGs  or troponins abnormal EXAM: Cardiac/Coronary  CT TECHNIQUE: The patient was scanned on a [employer]. PROTOCOL: A 120 kV prospective scan was triggered in the descending thoracic aorta at 111 HU's. Axial non-contrast 3 mm slices were carried out through the heart. The data set was analyzed on a dedicated work station and scored using the Agatston method. Gantry rotation speed was 250 msecs and collimation was .6 mm. Beta blockade and 0.8 mg of sl NTG was given. The 3D data set was reconstructed in 5% intervals of the 35-75 % of the R-R cycle. Systolic and diastolic phases were analyzed on a dedicated work station using MPR, MIP and VRT modes. The patient received contrast: 80mL OMNIPAQUE  IOHEXOL  350 MG/ML SOLN. FINDINGS: Image quality: Good Noise artifact is: Mild respiratory motion Coronary calcium score is 44, which places the patient in the 88th percentile for age and sex matched control. Coronary arteries: Normal coronary origins.  Right dominance. Right Coronary Artery: No detectable plaque or stenosis. Left Main Coronary Artery: Minimal plaque distal LM, <25% stenosis. Left Anterior Descending Coronary Artery: Minimal plaque ostial LAD, <25% stenosis. Left Circumflex Artery: No detectable plaque or stenosis. Aorta: Normal size, 31 mm at the mid ascending aorta (level of the PA bifurcation) measured double oblique. Aortic Valve: No calcifications. Other findings: Normal pulmonary vein drainage into the left atrium. Normal left atrial appendage without thrombus. Normal size  of the pulmonary artery. Please see separate report from [facility] for non-cardiac findings. IMPRESSION: 1. 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 2. Coronary calcium score is 44, which places the patient in the 88th percentile for age and sex matched control. 3. Normal coronary origins with right dominance. RECOMMENDATIONS: CAD-RADS 1. Minimal non-obstructive CAD (0-24%). Consider non-atherosclerotic causes of chest  pain. Consider preventive therapy and risk factor modification. Electronically Signed   By: Soyla Merck M.D.   On: 01/18/2025 14:49     Labs:   Basic Metabolic Panel: Recent Labs  Lab 01/16/25 2258 01/17/25 1612 01/18/25 0331  NA 140 141 141  K 3.7 3.6 3.6  CL 102 104 106  CO2 22 23 22   GLUCOSE 94 139* 109*  BUN 14 13 12   CREATININE 0.99 0.91 0.90  CALCIUM 9.9 9.3 9.0  MG  --   --  2.1  PHOS  --   --  3.5   GFR Estimated Creatinine Clearance: 73 mL/min (by C-G formula based on SCr of 0.9 mg/dL). Liver Function Tests: Recent Labs  Lab 01/17/25 1612 01/18/25 0331  AST 17 16  ALT 7 5  ALKPHOS 85 75  BILITOT 1.2 1.1  PROT 7.3 6.4*  ALBUMIN 4.6 3.9   No results for input(s): LIPASE, AMYLASE in the last 168 hours. No results for input(s): AMMONIA in the last 168 hours. Coagulation profile No results for input(s): INR, PROTIME in the last 168 hours.  CBC: Recent Labs  Lab 01/16/25 2258 01/17/25 1612 01/18/25 0331  WBC 10.6* 9.4 8.7  NEUTROABS  --  6.6  --   HGB 13.0 12.3 11.3*  HCT 39.3 37.3 33.9*  MCV 90.6 90.3 89.9  PLT 243 227 197   Cardiac Enzymes: No results for input(s): CKTOTAL, CKMB, CKMBINDEX, TROPONINI in the last 168 hours. BNP: Invalid input(s): POCBNP CBG: No results for input(s): GLUCAP in the last 168 hours. D-Dimer No results for input(s): DDIMER in the last 72 hours. Hgb A1c No results for input(s): HGBA1C in the last 72 hours. Lipid Profile No results for input(s): CHOL, HDL, LDLCALC, TRIG, CHOLHDL, LDLDIRECT in the last 72 hours. Thyroid function studies No results for input(s): TSH, T4TOTAL, T3FREE, THYROIDAB in the last 72 hours.  Invalid input(s): FREET3 Anemia work up No results for input(s): VITAMINB12, FOLATE, FERRITIN, TIBC, IRON, RETICCTPCT in the last 72 hours. Microbiology No results found for this or any previous visit (from the past 240  hours).   Discharge Instructions:   Discharge Instructions     Ambulatory referral to Gastroenterology   Complete by: As directed    What is the reason for referral?: Other Comment - EGD?   Diet - low sodium heart healthy   Complete by: As directed    Increase activity slowly   Complete by: As directed       Allergies as of 01/18/2025   No Known Allergies      Medication List     TAKE these medications    aspirin  EC 81 MG tablet Take 1 tablet (81 mg total) by mouth daily. Swallow whole. Start taking on: January 19, 2025 What changed:  medication strength how much to take when to take this reasons to take this additional instructions   clindamycin 1 % lotion Commonly known as: CLEOCIN T Apply 1 Application topically 2 (two) times daily.   clobetasol  ointment 0.05 % Commonly known as: TEMOVATE  Apply 1 Application topically 2 (two) times daily.   hydrOXYzine  25 MG capsule Commonly  known as: VISTARIL  Take 1 capsule (25 mg total) by mouth every 8 (eight) hours as needed for itching.   omeprazole  20 MG capsule Commonly known as: PRILOSEC Take 1 capsule (20 mg total) by mouth 2 (two) times daily before a meal. What changed: when to take this   sucralfate  1 g tablet Commonly known as: Carafate  Take 1 tablet (1 g total) by mouth 4 (four) times daily -  with meals and at bedtime. What changed:  when to take this reasons to take this        Follow-up Information     Carver, Charles K, DO Follow up.   Specialty: Gastroenterology Why: consideration of EGD Contact information: [address] [place] [address] [phone]                  Time coordinating discharge: 45 minutes  Signed:  Harlene RAYMOND Bowl DO  [facility] 01/18/2025, 4:49 PM      "

## 2025-01-20 ENCOUNTER — Telehealth: Payer: Self-pay

## 2025-01-20 NOTE — Transitions of Care (Post Inpatient/ED Visit) (Signed)
" ° °  01/20/2025  Name: April Ford MRN: [id number] DOB: 1966/11/18  Today's TOC FU Call Status: Today's TOC FU Call Status:: Successful TOC FU Call Completed TOC FU Call Complete Date: 01/20/25  Patient's Name and Date of Birth confirmed. DOB, Name  Transition Care Management Follow-up Telephone Call Date of Discharge: 01/18/25 Discharge Facility: [facility]) Type of Discharge: Inpatient Admission Primary Inpatient Discharge Diagnosis:: Chest Pain How have you been since you were released from the hospital?: Better Any questions or concerns?: No  Items Reviewed: Did you receive and understand the discharge instructions provided?: Yes Medications obtained,verified, and reconciled?: Yes (Medications Reviewed) Any new allergies since your discharge?: No Dietary orders reviewed?: Yes Type of Diet Ordered:: Low Sodum/ Heart Healthy Do you have support at home?: Yes  Medications Reviewed Today: Medications Reviewed Today     Reviewed by Lavelle Charmaine NOVAK, LPN (Licensed Practical Nurse) on 01/20/25 at 1004  Med List Status: <None>   Medication Order Taking? Sig Documenting Provider Last Dose Status Informant  aspirin  EC 81 MG tablet [id number] Yes Take 1 tablet (81 mg total) by mouth daily. Swallow whole. Vann, Jessica U, DO  Active   clindamycin (CLEOCIN T) 1 % lotion [id number] Yes Apply 1 Application topically 2 (two) times daily. [provider]  Active   clobetasol  ointment (TEMOVATE ) 0.05 % [id number] Yes Apply 1 Application topically 2 (two) times daily. Cook, [name], DO  Active   hydrOXYzine  (VISTARIL ) 25 MG capsule [id number] Yes Take 1 capsule (25 mg total) by mouth every 8 (eight) hours as needed for itching. Cook, [name], DO  Active   omeprazole  (PRILOSEC) 20 MG capsule [id number] Yes Take 1 capsule (20 mg total) by mouth 2 (two) times daily before a meal. Juvenal, Jessica U, DO  Active   sucralfate  (CARAFATE ) 1 g tablet [id number] Yes Take 1 tablet (1 g total) by mouth  4 (four) times daily -  with meals and at bedtime. Vann, Jessica U, DO  Active             Home Care and Equipment/Supplies: Were Home Health Services Ordered?: NA Any new equipment or medical supplies ordered?: NA  Functional Questionnaire: Do you need assistance with bathing/showering or dressing?: No Do you need assistance with meal preparation?: No Do you need assistance with eating?: No Do you have difficulty maintaining continence: No Do you need assistance with getting out of bed/getting out of a chair/moving?: No Do you have difficulty managing or taking your medications?: No  Follow up appointments reviewed: PCP Follow-up appointment confirmed?: Yes Date of PCP follow-up appointment?: 01/27/25 Follow-up Provider: Dr. Jacqulyn Ahle Specialist [facility] Follow-up appointment confirmed?: No (GI-Dr. Cindie) Reason Specialist Follow-Up Not Confirmed: Patient has Specialist Provider Number and will Call for Appointment Do you need transportation to your follow-up appointment?: No Do you understand care options if your condition(s) worsen?: Yes-patient verbalized understanding    SIGNATURE Charmaine Lavelle, LPN [facility] Advisor [facility] You Are. We Are. [facility] Direct Dial [phone]  "

## 2025-01-27 ENCOUNTER — Inpatient Hospital Stay: Admitting: Family Medicine

## 2025-01-28 ENCOUNTER — Ambulatory Visit: Admitting: Family Medicine

## 2025-01-28 ENCOUNTER — Telehealth: Payer: Self-pay | Admitting: *Deleted

## 2025-01-28 VITALS — BP 133/78 | HR 94 | Temp 98.4°F | Ht 67.0 in | Wt 184.2 lb

## 2025-01-28 DIAGNOSIS — R079 Chest pain, unspecified: Secondary | ICD-10-CM | POA: Diagnosis not present

## 2025-01-28 DIAGNOSIS — J449 Chronic obstructive pulmonary disease, unspecified: Secondary | ICD-10-CM

## 2025-01-28 NOTE — Patient Instructions (Addendum)
 Continue your medications.    Follow up in 6 months

## 2025-01-28 NOTE — Telephone Encounter (Signed)
-----   Message from [name], MD sent at 01/21/2025 10:33 AM EST ----- No obstructive CAD good

## 2025-01-28 NOTE — Assessment & Plan Note (Signed)
 Stable
